# Patient Record
Sex: Female | Born: 1965 | Race: Black or African American | Hispanic: No | Marital: Married | Smoking: Never smoker
Health system: Southern US, Community
[De-identification: ages and names within clinical notes are randomized; demographics above are authoritative.]

## PROBLEM LIST (undated history)

## (undated) DIAGNOSIS — IMO0002 Reserved for concepts with insufficient information to code with codable children: Secondary | ICD-10-CM

## (undated) DIAGNOSIS — J309 Allergic rhinitis, unspecified: Secondary | ICD-10-CM

## (undated) DIAGNOSIS — D219 Benign neoplasm of connective and other soft tissue, unspecified: Secondary | ICD-10-CM

## (undated) DIAGNOSIS — D649 Anemia, unspecified: Secondary | ICD-10-CM

## (undated) DIAGNOSIS — I10 Essential (primary) hypertension: Secondary | ICD-10-CM

## (undated) DIAGNOSIS — N946 Dysmenorrhea, unspecified: Secondary | ICD-10-CM

## (undated) DIAGNOSIS — B019 Varicella without complication: Secondary | ICD-10-CM

## (undated) HISTORY — DX: Varicella without complication: B01.9

## (undated) HISTORY — PX: NASAL SINUS SURGERY: SHX719

## (undated) HISTORY — DX: Allergic rhinitis, unspecified: J30.9

## (undated) HISTORY — PX: ABDOMINAL HYSTERECTOMY: SHX81

## (undated) HISTORY — DX: Benign neoplasm of connective and other soft tissue, unspecified: D21.9

## (undated) HISTORY — DX: Reserved for concepts with insufficient information to code with codable children: IMO0002

## (undated) HISTORY — DX: Dysmenorrhea, unspecified: N94.6

---

## 1998-03-29 ENCOUNTER — Ambulatory Visit (HOSPITAL_COMMUNITY): Admission: RE | Admit: 1998-03-29 | Discharge: 1998-03-29 | Payer: Self-pay | Admitting: *Deleted

## 1998-10-05 ENCOUNTER — Emergency Department (HOSPITAL_COMMUNITY): Admission: EM | Admit: 1998-10-05 | Discharge: 1998-10-05 | Payer: Self-pay | Admitting: Emergency Medicine

## 1999-01-06 ENCOUNTER — Other Ambulatory Visit: Admission: RE | Admit: 1999-01-06 | Discharge: 1999-01-06 | Payer: Self-pay | Admitting: Obstetrics and Gynecology

## 1999-04-10 ENCOUNTER — Encounter (INDEPENDENT_AMBULATORY_CARE_PROVIDER_SITE_OTHER): Payer: Self-pay

## 1999-04-10 ENCOUNTER — Inpatient Hospital Stay (HOSPITAL_COMMUNITY): Admission: AD | Admit: 1999-04-10 | Discharge: 1999-04-13 | Payer: Self-pay | Admitting: Obstetrics and Gynecology

## 1999-04-14 ENCOUNTER — Encounter (HOSPITAL_COMMUNITY): Admission: RE | Admit: 1999-04-14 | Discharge: 1999-07-13 | Payer: Self-pay | Admitting: Gynecology

## 2000-05-07 ENCOUNTER — Other Ambulatory Visit: Admission: RE | Admit: 2000-05-07 | Discharge: 2000-05-07 | Payer: Self-pay | Admitting: *Deleted

## 2001-05-23 ENCOUNTER — Other Ambulatory Visit: Admission: RE | Admit: 2001-05-23 | Discharge: 2001-05-23 | Payer: Self-pay | Admitting: *Deleted

## 2001-11-04 ENCOUNTER — Emergency Department (HOSPITAL_COMMUNITY): Admission: EM | Admit: 2001-11-04 | Discharge: 2001-11-04 | Payer: Self-pay | Admitting: *Deleted

## 2002-05-11 ENCOUNTER — Other Ambulatory Visit: Admission: RE | Admit: 2002-05-11 | Discharge: 2002-05-11 | Payer: Self-pay | Admitting: *Deleted

## 2002-09-15 ENCOUNTER — Inpatient Hospital Stay (HOSPITAL_COMMUNITY): Admission: RE | Admit: 2002-09-15 | Discharge: 2002-09-17 | Payer: Self-pay | Admitting: *Deleted

## 2002-09-15 ENCOUNTER — Encounter (INDEPENDENT_AMBULATORY_CARE_PROVIDER_SITE_OTHER): Payer: Self-pay | Admitting: *Deleted

## 2003-01-09 ENCOUNTER — Ambulatory Visit (HOSPITAL_COMMUNITY): Admission: RE | Admit: 2003-01-09 | Discharge: 2003-01-09 | Payer: Self-pay | Admitting: Family Medicine

## 2003-01-09 ENCOUNTER — Encounter: Payer: Self-pay | Admitting: Family Medicine

## 2003-05-09 ENCOUNTER — Encounter: Admission: RE | Admit: 2003-05-09 | Discharge: 2003-05-09 | Payer: Self-pay | Admitting: Family Medicine

## 2003-05-09 ENCOUNTER — Encounter: Payer: Self-pay | Admitting: Family Medicine

## 2003-07-19 ENCOUNTER — Emergency Department (HOSPITAL_COMMUNITY): Admission: EM | Admit: 2003-07-19 | Discharge: 2003-07-19 | Payer: Self-pay | Admitting: Emergency Medicine

## 2004-05-22 ENCOUNTER — Emergency Department (HOSPITAL_COMMUNITY): Admission: EM | Admit: 2004-05-22 | Discharge: 2004-05-22 | Payer: Self-pay | Admitting: Emergency Medicine

## 2004-07-11 ENCOUNTER — Ambulatory Visit (HOSPITAL_COMMUNITY): Admission: RE | Admit: 2004-07-11 | Discharge: 2004-07-11 | Payer: Self-pay | Admitting: Family Medicine

## 2004-09-23 ENCOUNTER — Ambulatory Visit (HOSPITAL_COMMUNITY): Admission: RE | Admit: 2004-09-23 | Discharge: 2004-09-23 | Payer: Self-pay | Admitting: Family Medicine

## 2005-06-13 ENCOUNTER — Emergency Department (HOSPITAL_COMMUNITY): Admission: EM | Admit: 2005-06-13 | Discharge: 2005-06-13 | Payer: Self-pay | Admitting: Family Medicine

## 2006-05-11 ENCOUNTER — Encounter: Admission: RE | Admit: 2006-05-11 | Discharge: 2006-05-11 | Payer: Self-pay | Admitting: Obstetrics and Gynecology

## 2006-05-18 ENCOUNTER — Encounter: Admission: RE | Admit: 2006-05-18 | Discharge: 2006-05-18 | Payer: Self-pay | Admitting: Obstetrics and Gynecology

## 2007-05-13 ENCOUNTER — Encounter: Admission: RE | Admit: 2007-05-13 | Discharge: 2007-05-13 | Payer: Self-pay | Admitting: Family Medicine

## 2007-10-19 ENCOUNTER — Ambulatory Visit (HOSPITAL_COMMUNITY): Admission: RE | Admit: 2007-10-19 | Discharge: 2007-10-19 | Payer: Self-pay | Admitting: Family Medicine

## 2008-03-25 ENCOUNTER — Emergency Department (HOSPITAL_COMMUNITY): Admission: EM | Admit: 2008-03-25 | Discharge: 2008-03-25 | Payer: Self-pay | Admitting: Emergency Medicine

## 2008-05-14 ENCOUNTER — Encounter: Admission: RE | Admit: 2008-05-14 | Discharge: 2008-05-14 | Payer: Self-pay | Admitting: Family Medicine

## 2009-05-15 ENCOUNTER — Encounter: Admission: RE | Admit: 2009-05-15 | Discharge: 2009-05-15 | Payer: Self-pay | Admitting: Family Medicine

## 2010-05-16 ENCOUNTER — Encounter: Admission: RE | Admit: 2010-05-16 | Discharge: 2010-05-16 | Payer: Self-pay | Admitting: Family Medicine

## 2010-08-05 ENCOUNTER — Ambulatory Visit (HOSPITAL_COMMUNITY)
Admission: RE | Admit: 2010-08-05 | Discharge: 2010-08-05 | Payer: Self-pay | Source: Home / Self Care | Attending: Family Medicine | Admitting: Family Medicine

## 2010-09-21 ENCOUNTER — Encounter: Payer: Self-pay | Admitting: Obstetrics and Gynecology

## 2011-01-16 NOTE — Op Note (Signed)
NAME:  Victoria Allen, Victoria Allen                    ACCOUNT NO.:  [id number]   MEDICAL RECORD NO.:  [id number]                   PATIENT TYPE:  INP   LOCATION:  9399                                 FACILITY:  [facility]   PHYSICIAN:  Georgina Peer, M.D.              DATE OF BIRTH:  03/23/1966   DATE OF PROCEDURE:  09/15/2002  DATE OF DISCHARGE:                                 OPERATIVE REPORT   PREOPERATIVE DIAGNOSES:  1. Abdominal pain.  2. Fibroid uterus.  3. Irregular bleeding.   POSTOPERATIVE DIAGNOSES:  1. Abdominal pain.  2. Fibroid uterus.  3. Irregular bleeding.   OPERATION PERFORMED:  Total abdominal hysterectomy.   SURGEON:  Georgina Peer, M.D.   ANESTHESIA:  General.   ESTIMATED BLOOD LOSS:  100 cc.   URINE OUTPUT:  500 cc, clear.   IV FLUIDS:  2300 cc, outflow 200 incisional catheter placed postoperatively.   COMPLICATIONS:  None.   FINDINGS:  Multiple subserosal anterior fibroids.  Normal tubes and ovaries.   INDICATIONS FOR PROCEDURE:  This 45 year old black female with abdominal  pain, especially up under the bladder from fibroids which were impinging on  the bladder, 6-8 weeks' size.  Also cramping and irregular bleeding was  noted.  The patient was admitted for correction of these problems with  ovarian conservation if possible.   DESCRIPTION OF PROCEDURE:  The patient was taken to the operating room,  placed in the dorsal supine position.  She was given a general anesthetic,  prepped and draped in a normal sterile fashion.  The vagina was prepped.  A  Foley catheter placed in her bladder.  A transverse skin incision was made  and the abdomen entered in normal  Pfannenstiel manner with bleeders  cauterized.  The patient was placed in Trendelenburg and a Balfour retractor  was placed.  The uterus had several subserosal fibroids varying from 3-6 cm  in diameter and one was pedunculated.  Tubes and ovaries were normal.  The  appendix normal.  The bowel  normal.  The cul-de-sac normal.  The bowel was  packed away.  The cornua of the uterus were grasped and the round ligaments  bilaterally were suture ligated and divided.  The utero-ovarian ligaments  were clamped, cut, and suture ligated and divided and free tied.  The  uterine vessels were skeletonized.  An anterior bladder flap was created and  the bladder pushed down off the anterior uterine segment.  The uterine  vessels bilaterally were clamped, cut, and suture ligated.  Progressive  bites of the cardinal and broad ligaments down to the level of the  uterosacral ligaments were taken and the bladder was advanced prior to this.  The uterosacral ligaments were clamped, cut, and suture ligated.  The  corners of the vagina were then entered and the corners sutured for security  and hemostasis.  The mid portion of the cuff was whipstitched with a running  suture  and the mid portion closed with a single suture.  Small areas of  bleeders were controlled with ties or fine superficial 2-0 sutures.  The  ureters bilaterally were identified.  There was excellent hemostasis to the  cuff and the pedicles.  The ovarian pedicles were secured to the broad  ligament stumps to suspend them.  The packs and retractors were removed.  The peritoneum was closed with Vicryl suture on the underside of the muscle  and fascia was cauterized for any bleeders.  The fascia was closed with  Vicryl suture.  An outflow 200 catheter was placed through the right edge of  the incision three fingerbreadths above the right edge and 20 cc of 0.25%  Marcaine was given subcutaneously under the incision.  The incision was then  closed with skin staples.  The wound was sterilely dressed.  The patient  received 2 grams of cefoxitin prior to the start of the case.  Sponge,  instrument, and needle counts were correct.  The patient returned to the  recovery area in stable condition.                                                Georgina Peer, M.D.    JPN/MEDQ  D:  09/15/2002  T:  09/15/2002  Job:  [id number]

## 2011-01-16 NOTE — Discharge Summary (Signed)
   NAME:  Victoria Allen, Victoria Allen                    ACCOUNT NO.:  [id number]   MEDICAL RECORD NO.:  [id number]                   PATIENT TYPE:  INP   LOCATION:  9327                                 [facility]ILITY:  [facility]   PHYSICIAN:  Georgina Peer, M.D.              DATE OF BIRTH:  July 16, 1966   DATE OF ADMISSION:  09/15/2002  DATE OF DISCHARGE:  09/17/2002                                 DISCHARGE SUMMARY   ADMISSION DIAGNOSES:  1. Fibroid uterus.  2. Abdominal pain.  3. Irregular bleeding.   DISCHARGE DIAGNOSES:  1. Fibroid uterus.  2. Abdominal pain.  3. Irregular bleeding.   PATIENT HISTORY:  The patient is a 45 year old black female with known  fibroids, abdominal pain, irregular bleeding.  For details see history and  physical.  The patient was admitted and underwent a total abdominal  hysterectomy under general anesthetic with no observed complications.  A  fibroid uterus was noted.  The patient postoperatively had an alpha 200  incisional catheter placed but by the end of the first night this catheter  was leaking and removed.  She was switched from PCA to p.o. medication and  did well.  She passed gas, had a bowel movement, and voided without  difficulty by the second day.  She was taking p.o. pain medicines.  Her  incision was clean and dry.  Postoperative hemoglobin was 10.4 from  preoperative of 12.2.  She was in good spirits and ambulating well and ready  for discharge.  She was discharged on the second postoperative day with a  prescription for Percocet 5/325 number 40.  She was given written discharge  instructions.  She will follow up in the office in four to five days for  staple removal.                                               Georgina Peer, M.D.    JPN/MEDQ  D:  09/17/2002  T:  09/18/2002  Job:  [id number]

## 2011-01-16 NOTE — H&P (Signed)
NAME:  KINDEL, ROCHEFORT                    ACCOUNT NO.:  [id number]   MEDICAL RECORD NO.:  [id number]                   PATIENT TYPE:  INP   LOCATION:  NA                                   [facility]   PHYSICIAN:  Georgina Peer, M.D.              DATE OF BIRTH:  1965/10/25   DATE OF ADMISSION:  09/15/2002  DATE OF DISCHARGE:                                HISTORY & PHYSICAL   ADMISSION DIAGNOSES:  1. Irregular bleeding.  2. Persistent dysmenorrhea.  3. Fibroid uterus.   HISTORY:  This is a 45 year old African-American female who has known  fibroids diagnosed on ultrasound.  Has had bleeding and intermenstrual  spotting and dysmenorrhea in spite of oral contraceptives and nonsteroidal  anti-inflammatory drugs.  She has elected hysterectomy for treatment with  ovarian conservation if appropriate.   PAST MEDICAL HISTORY:  The patient has had one previous delivery.  No heart,  lung, or kidney disease.  The patient has had normal Pap smears.  She has no  sign of genital infection.  She has had normal TSH and prolactin.   REVIEW OF SYSTEMS:  Otherwise negative.   FAMILY HISTORY:  Significant for a positive hypertension, anemia, asthma,  diabetes, cancer, strokes, and migraines.   PAST SURGICAL HISTORY:  Tonsils and sinus surgery.   SOCIAL HISTORY:  The patient is a nonsmoker.  No alcohol.  The patient is of  the [place] faith and attends [employer].   ALLERGIES:  The patient is allergic to PENICILLIN.   PHYSICAL EXAMINATION:  VITAL SIGNS:  Blood pressure 150/90.  HEENT:  Normal.  NECK:  Without thyromegaly or JVD.  LUNGS:  Clear.  HEART:  Regular rate and rhythm.  BREASTS:  Without masses.  ABDOMEN:  Soft and nontender.  There is no masses, organomegaly, or  hepatosplenomegaly.  There is no CVA tenderness.  PELVIC:  Normal external genitals, vagina, and cervix.  Uterus is 6-8 weeks  size with irregularities consistent with fibroids.  An anterior  fibroid  projecting up into the bladder is noted which is tender.  There is good  mobility and no adnexal masses or tenderness.   ADMISSION IMPRESSION:  1. Fibroid uterus causing pain.  2. Dysmenorrhea.  3. Irregular bleeding unresponsive to conservative measures.   PLAN:  The patient to be admitted for total abdominal hysterectomy with  ovarian conservation, removal of the uterus and cervix.  She is aware of  risks, complications of surgery including bowel, bladder, vascular injury,  fistula formation, pelvic prolapse, increased risk of blood clots, and  anesthetic complications.  She accepts these and is willing to proceed.  Surgery is scheduled September 15, 2002 12:30 [facility].  Georgina Peer, M.D.    JPN/MEDQ  D:  09/15/2002  T:  09/15/2002  Job:  [id number]   cc:   [facility]

## 2011-04-07 ENCOUNTER — Other Ambulatory Visit: Payer: Self-pay | Admitting: Family Medicine

## 2011-04-07 DIAGNOSIS — Z1231 Encounter for screening mammogram for malignant neoplasm of breast: Secondary | ICD-10-CM

## 2011-05-18 ENCOUNTER — Ambulatory Visit
Admission: RE | Admit: 2011-05-18 | Discharge: 2011-05-18 | Disposition: A | Discharge status: Home / Self Care | Payer: Managed Care, Other (non HMO) | Source: Ambulatory Visit | Attending: Family Medicine | Admitting: Family Medicine

## 2011-05-18 DIAGNOSIS — Z1231 Encounter for screening mammogram for malignant neoplasm of breast: Secondary | ICD-10-CM

## 2011-05-29 LAB — POCT URINALYSIS DIP (DEVICE)
Glucose, UA: NEGATIVE
Ketones, ur: 15 — AB
Nitrite: NEGATIVE
Operator id: 282151
Protein, ur: NEGATIVE
Specific Gravity, Urine: >=1.03
Urobilinogen, UA: 0.2
pH: 5.5

## 2011-05-29 LAB — POCT PREGNANCY, URINE
Operator id: 282151
Preg Test, Ur: NEGATIVE

## 2012-04-12 ENCOUNTER — Other Ambulatory Visit: Payer: Self-pay | Admitting: Family Medicine

## 2012-04-12 DIAGNOSIS — Z1231 Encounter for screening mammogram for malignant neoplasm of breast: Secondary | ICD-10-CM

## 2012-05-18 ENCOUNTER — Ambulatory Visit: Payer: Managed Care, Other (non HMO)

## 2012-06-03 ENCOUNTER — Ambulatory Visit
Admission: RE | Admit: 2012-06-03 | Discharge: 2012-06-03 | Disposition: A | Discharge status: Home / Self Care | Payer: 59 | Source: Ambulatory Visit | Attending: Family Medicine | Admitting: Family Medicine

## 2012-06-03 DIAGNOSIS — Z1231 Encounter for screening mammogram for malignant neoplasm of breast: Secondary | ICD-10-CM

## 2012-09-02 ENCOUNTER — Encounter (HOSPITAL_COMMUNITY): Payer: Self-pay | Admitting: Emergency Medicine

## 2012-09-02 ENCOUNTER — Emergency Department (HOSPITAL_COMMUNITY)
Admission: EM | Admit: 2012-09-02 | Discharge: 2012-09-02 | Disposition: A | Discharge status: Home / Self Care | Payer: 59 | Attending: Emergency Medicine | Admitting: Emergency Medicine

## 2012-09-02 DIAGNOSIS — I10 Essential (primary) hypertension: Secondary | ICD-10-CM | POA: Insufficient documentation

## 2012-09-02 DIAGNOSIS — Z79899 Other long term (current) drug therapy: Secondary | ICD-10-CM | POA: Insufficient documentation

## 2012-09-02 DIAGNOSIS — R110 Nausea: Secondary | ICD-10-CM | POA: Insufficient documentation

## 2012-09-02 DIAGNOSIS — R42 Dizziness and giddiness: Secondary | ICD-10-CM

## 2012-09-02 HISTORY — DX: Essential (primary) hypertension: I10

## 2012-09-02 MED ORDER — MECLIZINE HCL 25 MG PO TABS: 25.0000 mg | ORAL_TABLET | Freq: Three times a day (TID) | ORAL | Status: DC | PRN

## 2012-09-02 MED ORDER — MECLIZINE HCL 25 MG PO TABS
25.0000 mg | ORAL_TABLET | Freq: Once | ORAL | Status: AC
  Administered 2012-09-02: 25 mg via ORAL
  Filled 2012-09-02: qty 1

## 2012-09-02 MED ORDER — DIAZEPAM 5 MG PO TABS: 5.0000 mg | ORAL_TABLET | Freq: Four times a day (QID) | ORAL | Status: DC | PRN

## 2012-09-02 MED ORDER — DIAZEPAM 5 MG PO TABS
5.0000 mg | ORAL_TABLET | Freq: Once | ORAL | Status: AC
  Administered 2012-09-02: 5 mg via ORAL
  Filled 2012-09-02: qty 1

## 2012-09-02 NOTE — ED Notes (Signed)
Per patient rolled over in bed and room became dizzy-nausea, vomiting-no chest pain, no SOB

## 2012-09-02 NOTE — ED Notes (Signed)
Per patient, states same kind of symptoms in October, only lasting seconds-resolved on its own

## 2012-09-02 NOTE — ED Provider Notes (Signed)
History    33y female with vertigo. Onset this morning. Patient with a sensation that the room is spinning to her left side. Patient first noticed this morning when she was getting out of bed. She laid back down with improvement. She then got back up to get dressed and symptoms returned. Again improved with rest. No other associated symptoms. Patient states that she did have a similar episode approximately one month ago which lasted for 10 seconds then resolved. This episode 1 month ago was associated with tinnitus. Her episodes today have not though. She denies any ear pain or sensation of ear fullness. No headaches. No visual changes. No numbness, tingling or loss of strength. No recent antibiotic use. No diuretic use. Pt recently came back from a cruise.   CSN: [id number]  Arrival date & time 09/02/12  7829   First MD Initiated Contact with Patient 09/02/12 0750      Chief Complaint  Patient presents with  . Dizziness  . Nausea    (Consider location/radiation/quality/duration/timing/severity/associated sxs/prior treatment) HPI  Past Medical History  Diagnosis Date  . Hypertension     History reviewed. No pertinent past surgical history.  History reviewed. No pertinent family history.  History  Substance Use Topics  . Smoking status: Never Smoker   . Smokeless tobacco: Not on file  . Alcohol Use: Yes    OB History    Grav Para Term Preterm Abortions TAB SAB Ect Mult Living                  Review of Systems  All systems reviewed and negative, other than as noted in HPI.   Allergies  Penicillins  Home Medications   Current Outpatient Rx  Name  Route  Sig  Dispense  Refill  . FEXOFENADINE HCL 180 MG PO TABS   Oral   Take 180 mg by mouth daily.         . IBUPROFEN 200 MG PO TABS   Oral   Take 800 mg by mouth every 8 (eight) hours as needed. For pain.         Marland Kitchen OLMESARTAN-AMLODIPINE-HCTZ 40-5-25 MG PO TABS   Oral   Take 1 tablet by mouth daily.             BP 153/97  Pulse 87  Temp 98.3 F (36.8 C) (Oral)  Resp 16  SpO2 100%  Physical Exam  Nursing note and vitals reviewed. Constitutional: She is oriented to person, place, and time. She appears well-developed and well-nourished. No distress.  HENT:  Head: Normocephalic and atraumatic.  Right Ear: External ear normal.  Left Ear: External ear normal.       Horizontal nystagmus elicited with Dix-Hallpike maneuver with patient's head turned to her right side. There was a latent period of approximately 3-4 seconds before onset of symptoms. Pt wouldn't allow repeat testing for fatiguability. Negative Hennebert's sign.   Eyes: Conjunctivae normal and EOM are normal. Pupils are equal, round, and reactive to light. Right eye exhibits no discharge. Left eye exhibits no discharge.  Neck: Neck supple.  Cardiovascular: Normal rate, regular rhythm and normal heart sounds.  Exam reveals no gallop and no friction rub.   No murmur heard. Pulmonary/Chest: Effort normal and breath sounds normal. No respiratory distress.  Abdominal: Soft. She exhibits no distension. There is no tenderness.  Musculoskeletal: She exhibits no edema and no tenderness.  Neurological: She is alert and oriented to person, place, and time. No cranial nerve deficit. She  exhibits normal muscle tone. Coordination normal.  Skin: Skin is warm and dry.  Psychiatric: She has a normal mood and affect. Her behavior is normal. Thought content normal.    ED Course  Procedures (including critical care time)  Labs Reviewed - No data to display No results found.  EKG:  Rhythm: normal sinus Rate: 76 Axis: normal  Intervals/Conduction: LAE ST segments: NS St changes Comparison: ST segment changes in the precordial leads and also inferiorly noted on previous EKG from 05/22/2004   1. Vertigo       MDM  47 year old female with vertigo. This is most likely a peripheral cause, probably BPPV. Her symptoms are positional.  Reproducible with Dix-Hallpike maneuvers. Latent period.  Nonfocal neurological examination. Consider central cause, but doubt. Plan symptomatic treatment and Epley Maneuver's. ENT referral if symptoms persist. Emergent return precautions discussed.          Raeford Razor, MD 09/02/12 [phone]

## 2013-05-23 ENCOUNTER — Other Ambulatory Visit: Payer: Self-pay

## 2013-05-23 DIAGNOSIS — Z1231 Encounter for screening mammogram for malignant neoplasm of breast: Secondary | ICD-10-CM

## 2013-06-09 ENCOUNTER — Ambulatory Visit
Admission: RE | Admit: 2013-06-09 | Discharge: 2013-06-09 | Disposition: A | Discharge status: Home / Self Care | Payer: 59 | Source: Ambulatory Visit

## 2013-06-09 DIAGNOSIS — Z1231 Encounter for screening mammogram for malignant neoplasm of breast: Secondary | ICD-10-CM

## 2013-08-28 ENCOUNTER — Other Ambulatory Visit (HOSPITAL_COMMUNITY): Payer: Self-pay | Admitting: Orthopedic Surgery

## 2013-08-28 DIAGNOSIS — M25562 Pain in left knee: Secondary | ICD-10-CM

## 2013-09-01 ENCOUNTER — Ambulatory Visit (HOSPITAL_COMMUNITY)
Admission: RE | Admit: 2013-09-01 | Discharge: 2013-09-01 | Disposition: A | Discharge status: Home / Self Care | Payer: 59 | Source: Ambulatory Visit | Attending: Orthopedic Surgery | Admitting: Orthopedic Surgery

## 2013-09-01 DIAGNOSIS — M25562 Pain in left knee: Secondary | ICD-10-CM

## 2013-09-01 DIAGNOSIS — M7120 Synovial cyst of popliteal space [Baker], unspecified knee: Secondary | ICD-10-CM | POA: Insufficient documentation

## 2013-09-01 DIAGNOSIS — M6740 Ganglion, unspecified site: Secondary | ICD-10-CM | POA: Insufficient documentation

## 2013-09-01 DIAGNOSIS — M25469 Effusion, unspecified knee: Secondary | ICD-10-CM | POA: Insufficient documentation

## 2013-09-01 DIAGNOSIS — M2240 Chondromalacia patellae, unspecified knee: Secondary | ICD-10-CM | POA: Insufficient documentation

## 2013-09-01 DIAGNOSIS — R609 Edema, unspecified: Secondary | ICD-10-CM | POA: Insufficient documentation

## 2013-09-04 ENCOUNTER — Ambulatory Visit (HOSPITAL_COMMUNITY): Payer: 59

## 2013-10-17 ENCOUNTER — Ambulatory Visit: Payer: 59 | Admitting: Physical Therapy

## 2013-10-23 ENCOUNTER — Ambulatory Visit: Payer: 59 | Attending: Family Medicine

## 2013-10-23 DIAGNOSIS — M25569 Pain in unspecified knee: Secondary | ICD-10-CM | POA: Insufficient documentation

## 2013-10-23 DIAGNOSIS — IMO0001 Reserved for inherently not codable concepts without codable children: Secondary | ICD-10-CM | POA: Insufficient documentation

## 2013-10-23 DIAGNOSIS — R269 Unspecified abnormalities of gait and mobility: Secondary | ICD-10-CM | POA: Insufficient documentation

## 2013-10-23 DIAGNOSIS — M25669 Stiffness of unspecified knee, not elsewhere classified: Secondary | ICD-10-CM | POA: Insufficient documentation

## 2013-10-30 ENCOUNTER — Ambulatory Visit: Payer: 59 | Attending: Family Medicine

## 2013-10-30 DIAGNOSIS — M25669 Stiffness of unspecified knee, not elsewhere classified: Secondary | ICD-10-CM | POA: Insufficient documentation

## 2013-10-30 DIAGNOSIS — M25569 Pain in unspecified knee: Secondary | ICD-10-CM | POA: Insufficient documentation

## 2013-10-30 DIAGNOSIS — R269 Unspecified abnormalities of gait and mobility: Secondary | ICD-10-CM | POA: Insufficient documentation

## 2013-10-30 DIAGNOSIS — IMO0001 Reserved for inherently not codable concepts without codable children: Secondary | ICD-10-CM | POA: Insufficient documentation

## 2014-05-08 ENCOUNTER — Other Ambulatory Visit: Payer: Self-pay

## 2014-05-08 DIAGNOSIS — Z1231 Encounter for screening mammogram for malignant neoplasm of breast: Secondary | ICD-10-CM

## 2014-06-11 ENCOUNTER — Ambulatory Visit
Admission: RE | Admit: 2014-06-11 | Discharge: 2014-06-11 | Disposition: A | Discharge status: Home / Self Care | Payer: 59 | Source: Ambulatory Visit

## 2014-06-11 DIAGNOSIS — Z1231 Encounter for screening mammogram for malignant neoplasm of breast: Secondary | ICD-10-CM

## 2014-06-12 ENCOUNTER — Other Ambulatory Visit: Payer: Self-pay | Admitting: Family Medicine

## 2014-06-12 DIAGNOSIS — R928 Other abnormal and inconclusive findings on diagnostic imaging of breast: Secondary | ICD-10-CM

## 2014-06-14 ENCOUNTER — Ambulatory Visit
Admission: RE | Admit: 2014-06-14 | Discharge: 2014-06-14 | Disposition: A | Discharge status: Home / Self Care | Payer: 59 | Source: Ambulatory Visit | Attending: Family Medicine | Admitting: Family Medicine

## 2014-06-14 ENCOUNTER — Ambulatory Visit: Admission: RE | Admit: 2014-06-14 | Payer: 59 | Source: Ambulatory Visit

## 2014-06-14 ENCOUNTER — Other Ambulatory Visit: Payer: Self-pay | Admitting: Family Medicine

## 2014-06-14 DIAGNOSIS — R928 Other abnormal and inconclusive findings on diagnostic imaging of breast: Secondary | ICD-10-CM

## 2014-06-21 ENCOUNTER — Other Ambulatory Visit: Payer: 59

## 2015-02-11 ENCOUNTER — Emergency Department (HOSPITAL_COMMUNITY)
Admission: EM | Admit: 2015-02-11 | Discharge: 2015-02-11 | Disposition: A | Discharge status: Home / Self Care | Payer: 59 | Source: Home / Self Care | Attending: Family Medicine | Admitting: Family Medicine

## 2015-02-11 ENCOUNTER — Encounter (HOSPITAL_COMMUNITY): Payer: Self-pay | Admitting: *Deleted

## 2015-02-11 ENCOUNTER — Emergency Department (INDEPENDENT_AMBULATORY_CARE_PROVIDER_SITE_OTHER): Payer: 59

## 2015-02-11 DIAGNOSIS — J4521 Mild intermittent asthma with (acute) exacerbation: Secondary | ICD-10-CM

## 2015-02-11 MED ORDER — METHYLPREDNISOLONE SODIUM SUCC 125 MG IJ SOLR
125.0000 mg | Freq: Once | INTRAMUSCULAR | Status: AC
  Administered 2015-02-11: 125 mg via INTRAMUSCULAR

## 2015-02-11 MED ORDER — ALBUTEROL SULFATE (2.5 MG/3ML) 0.083% IN NEBU
5.0000 mg | INHALATION_SOLUTION | Freq: Once | RESPIRATORY_TRACT | Status: AC
  Administered 2015-02-11: 5 mg via RESPIRATORY_TRACT

## 2015-02-11 MED ORDER — IPRATROPIUM BROMIDE 0.02 % IN SOLN
0.5000 mg | Freq: Once | RESPIRATORY_TRACT | Status: AC
  Administered 2015-02-11: 0.5 mg via RESPIRATORY_TRACT

## 2015-02-11 MED ORDER — IPRATROPIUM BROMIDE 0.02 % IN SOLN
RESPIRATORY_TRACT | Status: AC
  Filled 2015-02-11: qty 2.5

## 2015-02-11 MED ORDER — ALBUTEROL SULFATE (2.5 MG/3ML) 0.083% IN NEBU
INHALATION_SOLUTION | RESPIRATORY_TRACT | Status: AC
  Filled 2015-02-11: qty 6

## 2015-02-11 MED ORDER — METHYLPREDNISOLONE SODIUM SUCC 125 MG IJ SOLR
INTRAMUSCULAR | Status: AC
  Filled 2015-02-11: qty 2

## 2015-02-11 MED ORDER — METHYLPREDNISOLONE 4 MG PO TBPK: ORAL_TABLET | ORAL | Status: DC

## 2015-02-11 NOTE — ED Notes (Signed)
Pt    Reports     Symptoms  Of  A  persistant  Cough     X  2  Weeks  Getting  Worse   Symptoms  Not  releived  By Otc  meds             Did not take  bp meds  Today

## 2015-02-11 NOTE — ED Provider Notes (Addendum)
CSN: [id number]     Arrival date & time 02/11/15  6 History   First MD Initiated Contact with Patient 02/11/15 1742     Chief Complaint  Patient presents with  . URI   (Consider location/radiation/quality/duration/timing/severity/associated sxs/prior Treatment) Patient is a 49 y.o. female presenting with URI. The history is provided by the patient.  URI Presenting symptoms: congestion, cough and rhinorrhea   Presenting symptoms: no fever   Severity:  Moderate Onset quality:  Gradual Duration:  8 days Progression:  Unchanged Chronicity:  New Relieved by:  None tried Worsened by:  Nothing tried Ineffective treatments:  None tried   Past Medical History  Diagnosis Date  . Hypertension   . Allergic rhinitis   . Fibroids     with dysmenorrhea  . Dysmenorrhea   . Varicella infection   . Herniated disc    Past Surgical History  Procedure Laterality Date  . Abdominal hysterectomy    . Nasal sinus surgery     Family History  Problem Relation Age of Onset  . Hypertension Mother   . Heart attack Father   . Cancer - Prostate Father   . Hypertension Father   . Diabetes Father   . Hypertension Maternal Grandmother   . Cancer - Colon Maternal Grandmother   . Hypertension Paternal Grandmother   . Heart attack Paternal Grandmother    History  Substance Use Topics  . Smoking status: Never Smoker   . Smokeless tobacco: Not on file  . Alcohol Use: Yes   OB History    No data available     Review of Systems  Constitutional: Negative.  Negative for fever.  HENT: Positive for congestion, postnasal drip and rhinorrhea.   Respiratory: Positive for cough. Negative for chest tightness.   Cardiovascular: Negative.     Allergies  Codeine and Penicillins  Home Medications   Prior to Admission medications   Medication Sig Start Date End Date Taking? Authorizing Provider  diazepam (VALIUM) 5 MG tablet Take 1 tablet (5 mg total) by mouth every 6 (six) hours as needed  (vertigo). 09/02/12   Virgel Manifold, MD  fexofenadine (ALLEGRA) 180 MG tablet Take 180 mg by mouth daily.    Historical Provider, MD  ibuprofen (ADVIL,MOTRIN) 200 MG tablet Take 800 mg by mouth every 8 (eight) hours as needed. For pain.    Historical Provider, MD  meclizine (ANTIVERT) 25 MG tablet Take 1 tablet (25 mg total) by mouth 3 (three) times daily as needed for dizziness (vertigo). 09/02/12   Virgel Manifold, MD  methylPREDNISolone (MEDROL DOSEPAK) 4 MG TBPK tablet follow package directions, start on tues, take daily until finished 02/11/15   Billy Fischer, MD  Olmesartan-Amlodipine-HCTZ [facility]) 40-5-25 MG TABS Take 1 tablet by mouth daily.    Historical Provider, MD   BP 218/104 mmHg  Pulse 72  Temp(Src) 98.6 F (37 C) (Oral)  Resp 20  SpO2 100% Physical Exam  Constitutional: She is oriented to person, place, and time. She appears well-developed and well-nourished. No distress.  HENT:  Mouth/Throat: Oropharynx is clear and moist.  Eyes: Conjunctivae are normal. Pupils are equal, round, and reactive to light.  Neck: Normal range of motion. Neck supple.  Cardiovascular: Normal rate and normal heart sounds.   Pulmonary/Chest: Effort normal. She has wheezes. She has rhonchi. She exhibits no tenderness.  Lymphadenopathy:    She has no cervical adenopathy.  Neurological: She is alert and oriented to person, place, and time.  Skin: Skin is warm and  dry.  Nursing note and vitals reviewed.   ED Course  Procedures (including critical care time) Labs Review Labs Reviewed - No data to display  Imaging Review Dg Chest 2 View  02/11/2015   CLINICAL DATA:  Wheezing at night for 2 weeks.  EXAM: CHEST  2 VIEW  COMPARISON:  PA and lateral chest 05/22/2004.  FINDINGS: Heart size and mediastinal contours are within normal limits. Both lungs are clear. Visualized skeletal structures are unremarkable.  IMPRESSION: Negative exam.   Electronically Signed   By: Inge Rise M.D.   On: 02/11/2015  18:20     MDM   1. Asthmatic bronchitis with exacerbation, mild intermittent   sx improved, lungs clear throughout after neb.    Billy Fischer, MD 02/11/15 Valerie Roys  Billy Fischer, MD 02/11/15 [phone]

## 2015-02-11 NOTE — Discharge Instructions (Signed)
Drink plenty of fluids as discussed, use medicine as prescribed, and mucinex or delsym for cough. Return or see your doctor if further problems °

## 2015-05-17 ENCOUNTER — Other Ambulatory Visit: Payer: Self-pay

## 2015-05-17 DIAGNOSIS — Z1231 Encounter for screening mammogram for malignant neoplasm of breast: Secondary | ICD-10-CM

## 2015-05-20 ENCOUNTER — Other Ambulatory Visit (HOSPITAL_COMMUNITY): Payer: Self-pay | Admitting: Family Medicine

## 2015-05-20 DIAGNOSIS — R202 Paresthesia of skin: Secondary | ICD-10-CM

## 2015-05-20 DIAGNOSIS — M542 Cervicalgia: Secondary | ICD-10-CM

## 2015-05-22 ENCOUNTER — Ambulatory Visit (HOSPITAL_COMMUNITY): Admission: RE | Admit: 2015-05-22 | Payer: 59 | Source: Ambulatory Visit

## 2015-05-29 ENCOUNTER — Ambulatory Visit (HOSPITAL_COMMUNITY): Admission: RE | Admit: 2015-05-29 | Payer: 59 | Source: Ambulatory Visit

## 2015-05-29 ENCOUNTER — Other Ambulatory Visit (HOSPITAL_COMMUNITY): Payer: 59

## 2015-05-29 ENCOUNTER — Ambulatory Visit (HOSPITAL_COMMUNITY)
Admission: RE | Admit: 2015-05-29 | Discharge: 2015-05-29 | Disposition: A | Discharge status: Home / Self Care | Payer: 59 | Source: Ambulatory Visit | Attending: Family Medicine | Admitting: Family Medicine

## 2015-05-29 DIAGNOSIS — R202 Paresthesia of skin: Secondary | ICD-10-CM | POA: Insufficient documentation

## 2015-05-29 DIAGNOSIS — M47892 Other spondylosis, cervical region: Secondary | ICD-10-CM | POA: Diagnosis not present

## 2015-05-29 DIAGNOSIS — M4802 Spinal stenosis, cervical region: Secondary | ICD-10-CM | POA: Insufficient documentation

## 2015-05-30 ENCOUNTER — Other Ambulatory Visit (HOSPITAL_COMMUNITY): Payer: 59

## 2015-05-30 ENCOUNTER — Ambulatory Visit (HOSPITAL_COMMUNITY): Admission: RE | Admit: 2015-05-30 | Payer: 59 | Source: Ambulatory Visit

## 2015-06-04 ENCOUNTER — Other Ambulatory Visit: Payer: Self-pay | Admitting: Neurological Surgery

## 2015-06-04 DIAGNOSIS — M5412 Radiculopathy, cervical region: Secondary | ICD-10-CM

## 2015-06-06 ENCOUNTER — Ambulatory Visit
Admission: RE | Admit: 2015-06-06 | Discharge: 2015-06-06 | Disposition: A | Discharge status: Home / Self Care | Payer: 59 | Source: Ambulatory Visit | Attending: Neurological Surgery | Admitting: Neurological Surgery

## 2015-06-06 DIAGNOSIS — M5412 Radiculopathy, cervical region: Secondary | ICD-10-CM

## 2015-06-06 MED ORDER — TRIAMCINOLONE ACETONIDE 40 MG/ML IJ SUSP (RADIOLOGY)
60.0000 mg | Freq: Once | INTRAMUSCULAR | Status: AC
  Administered 2015-06-06: 60 mg via EPIDURAL

## 2015-06-06 MED ORDER — IOHEXOL 300 MG/ML  SOLN
1.0000 mL | Freq: Once | INTRAMUSCULAR | Status: DC | PRN
  Administered 2015-06-06: 1 mL via EPIDURAL

## 2015-06-06 NOTE — Discharge Instructions (Signed)

## 2015-06-17 ENCOUNTER — Ambulatory Visit
Admission: RE | Admit: 2015-06-17 | Discharge: 2015-06-17 | Disposition: A | Discharge status: Home / Self Care | Payer: 59 | Source: Ambulatory Visit

## 2015-06-17 DIAGNOSIS — Z1231 Encounter for screening mammogram for malignant neoplasm of breast: Secondary | ICD-10-CM

## 2015-06-25 ENCOUNTER — Other Ambulatory Visit (HOSPITAL_COMMUNITY): Payer: Self-pay | Admitting: Neurological Surgery

## 2015-07-09 ENCOUNTER — Encounter (HOSPITAL_COMMUNITY): Payer: Self-pay

## 2015-07-09 ENCOUNTER — Encounter (HOSPITAL_COMMUNITY)
Admission: RE | Admit: 2015-07-09 | Discharge: 2015-07-09 | Disposition: A | Discharge status: Home / Self Care | Payer: 59 | Source: Ambulatory Visit | Attending: Neurological Surgery | Admitting: Neurological Surgery

## 2015-07-09 DIAGNOSIS — M4802 Spinal stenosis, cervical region: Secondary | ICD-10-CM | POA: Diagnosis not present

## 2015-07-09 DIAGNOSIS — Z01818 Encounter for other preprocedural examination: Secondary | ICD-10-CM | POA: Diagnosis not present

## 2015-07-09 HISTORY — DX: Anemia, unspecified: D64.9

## 2015-07-09 LAB — BASIC METABOLIC PANEL
Anion gap: 8 (ref 5–15)
BUN: 11 mg/dL (ref 6–20)
CO2: 31 mmol/L (ref 22–32)
Calcium: 9.6 mg/dL (ref 8.9–10.3)
Chloride: 98 mmol/L — ABNORMAL LOW (ref 101–111)
Creatinine, Ser: 0.77 mg/dL (ref 0.44–1.00)
GFR calc Af Amer: >60 mL/min (ref >60)
GFR calc non Af Amer: >60 mL/min (ref >60)
Glucose, Bld: 100 mg/dL — ABNORMAL HIGH (ref 65–99)
Potassium: 3.3 mmol/L — ABNORMAL LOW (ref 3.5–5.1)
Sodium: 137 mmol/L (ref 135–145)

## 2015-07-09 LAB — PROTIME-INR
INR: 1.03 (ref 0.00–1.49)
Prothrombin Time: 13.7 seconds (ref 11.6–15.2)

## 2015-07-09 LAB — CBC WITH DIFFERENTIAL/PLATELET
Basophils Absolute: 0 10*3/uL (ref 0.0–0.1)
Basophils Relative: 0 %
Eosinophils Absolute: 0.1 10*3/uL (ref 0.0–0.7)
Eosinophils Relative: 1 %
HCT: 44.8 % (ref 36.0–46.0)
Hemoglobin: 14.5 g/dL (ref 12.0–15.0)
Lymphocytes Relative: 27 %
Lymphs Abs: 3.1 10*3/uL (ref 0.7–4.0)
MCH: 28 pg (ref 26.0–34.0)
MCHC: 32.4 g/dL (ref 30.0–36.0)
MCV: 86.7 fL (ref 78.0–100.0)
Monocytes Absolute: 0.8 10*3/uL (ref 0.1–1.0)
Monocytes Relative: 7 %
Neutro Abs: 7.4 10*3/uL (ref 1.7–7.7)
Neutrophils Relative %: 65 %
Platelets: 242 10*3/uL (ref 150–400)
RBC: 5.17 MIL/uL — ABNORMAL HIGH (ref 3.87–5.11)
RDW: 12.9 % (ref 11.5–15.5)
WBC: 11.4 10*3/uL — ABNORMAL HIGH (ref 4.0–10.5)

## 2015-07-09 LAB — SURGICAL PCR SCREEN
MRSA, PCR: NEGATIVE
Staphylococcus aureus: NEGATIVE

## 2015-07-09 NOTE — Pre-Procedure Instructions (Signed)
    Zarianna R Bircher  07/09/2015      [facility] - [place], [place] - [address] [place] [address] Phone: [phone] Fax: [phone]    Your procedure is scheduled on 07-17-2015    Wednesday  .  Report to [facility] Admitting at 9:45  A.M.    Call this number if you have problems the morning of surgery:  [phone]   Remember:  Do not eat food or drink liquids after midnight.   Take these medicines the morning of surgery with A SIP OF WATER diazepam(valium) if needed,meclizine)Antivert),if needed,fexofenadine   Do not wear jewelry  Do not wear lotions, powders, or perfumes.  You may not wear deodorant.  Do not shave 48 hours prior to surgery.  Men may shave face and neck.   Do not bring valuables to the hospital.  [facility] is not responsible for any belongings or valuables.  Contacts, dentures or bridgework may not be worn into surgery.  Leave your suitcase in the car.  After surgery it may be brought to your room.  For patients admitted to the hospital, discharge time will be determined by your treatment team.  Patients discharged the day of surgery will not be allowed to drive home.    Special instructions:  See attached Sheet for instructions on CHG shower   Please read over the following fact sheets that you were given. Pain Booklet and Surgical Site Infection Prevention

## 2015-07-10 NOTE — Progress Notes (Signed)
Anesthesia Chart Review: Patient is a 49 year old female scheduled for C5-6, C6-7 ACDF on 07/17/15 by Dr. Ronnald Ramp.  History includes HTN, anemia, never smoker, nasal sinus surgery.  07/09/15 EKG: NSR with sinus arrhythmia, possible LAE, nonspecific ST/T wave abnormality. EKG appears stable when compared to tracing from 09/02/12.  02/11/15 CXR: Negative.  Preoperative labs noted.  If no acute changes then I anticipate that she can proceed.  George Hugh [facility]/Anesthesiology Phone [phone] 07/10/2015 2:51 PM

## 2015-07-17 ENCOUNTER — Ambulatory Visit (HOSPITAL_COMMUNITY)
Admission: RE | Admit: 2015-07-17 | Discharge: 2015-07-18 | Disposition: A | Discharge status: Home / Self Care | Payer: 59 | Source: Ambulatory Visit | Attending: Neurological Surgery | Admitting: Neurological Surgery

## 2015-07-17 ENCOUNTER — Encounter (HOSPITAL_COMMUNITY)
Admission: RE | Disposition: A | Discharge status: Home / Self Care | Payer: Self-pay | Source: Ambulatory Visit | Attending: Neurological Surgery

## 2015-07-17 ENCOUNTER — Ambulatory Visit (HOSPITAL_COMMUNITY): Payer: 59 | Admitting: Vascular Surgery

## 2015-07-17 ENCOUNTER — Ambulatory Visit (HOSPITAL_COMMUNITY): Payer: 59 | Admitting: Anesthesiology

## 2015-07-17 ENCOUNTER — Encounter (HOSPITAL_COMMUNITY): Payer: Self-pay | Admitting: *Deleted

## 2015-07-17 ENCOUNTER — Ambulatory Visit (HOSPITAL_COMMUNITY): Payer: 59

## 2015-07-17 DIAGNOSIS — M50222 Other cervical disc displacement at C5-C6 level: Secondary | ICD-10-CM | POA: Diagnosis not present

## 2015-07-17 DIAGNOSIS — M50223 Other cervical disc displacement at C6-C7 level: Secondary | ICD-10-CM | POA: Insufficient documentation

## 2015-07-17 DIAGNOSIS — M47812 Spondylosis without myelopathy or radiculopathy, cervical region: Secondary | ICD-10-CM | POA: Diagnosis present

## 2015-07-17 DIAGNOSIS — Z79899 Other long term (current) drug therapy: Secondary | ICD-10-CM | POA: Diagnosis not present

## 2015-07-17 DIAGNOSIS — I10 Essential (primary) hypertension: Secondary | ICD-10-CM | POA: Diagnosis not present

## 2015-07-17 DIAGNOSIS — Z791 Long term (current) use of non-steroidal anti-inflammatories (NSAID): Secondary | ICD-10-CM | POA: Diagnosis not present

## 2015-07-17 DIAGNOSIS — Z981 Arthrodesis status: Secondary | ICD-10-CM

## 2015-07-17 DIAGNOSIS — J309 Allergic rhinitis, unspecified: Secondary | ICD-10-CM | POA: Insufficient documentation

## 2015-07-17 DIAGNOSIS — Z419 Encounter for procedure for purposes other than remedying health state, unspecified: Secondary | ICD-10-CM

## 2015-07-17 HISTORY — PX: ANTERIOR CERVICAL DECOMP/DISCECTOMY FUSION: SHX1161

## 2015-07-17 SURGERY — ANTERIOR CERVICAL DECOMPRESSION/DISCECTOMY FUSION 2 LEVELS
Anesthesia: General

## 2015-07-17 MED ORDER — ONDANSETRON HCL 4 MG/2ML IJ SOLN
4.0000 mg | INTRAMUSCULAR | Status: DC | PRN
  Administered 2015-07-17: 4 mg via INTRAVENOUS
  Filled 2015-07-17: qty 2

## 2015-07-17 MED ORDER — HYDROMORPHONE HCL 1 MG/ML IJ SOLN
INTRAMUSCULAR | Status: AC
  Filled 2015-07-17: qty 1

## 2015-07-17 MED ORDER — PROPOFOL 10 MG/ML IV BOLUS
INTRAVENOUS | Status: AC
  Filled 2015-07-17: qty 20

## 2015-07-17 MED ORDER — 0.9 % SODIUM CHLORIDE (POUR BTL) OPTIME
TOPICAL | Status: DC | PRN
  Administered 2015-07-17: 1000 mL

## 2015-07-17 MED ORDER — ONDANSETRON HCL 4 MG/2ML IJ SOLN
INTRAMUSCULAR | Status: DC | PRN
  Administered 2015-07-17: 4 mg via INTRAVENOUS

## 2015-07-17 MED ORDER — SODIUM CHLORIDE 0.9 % IJ SOLN: 3.0000 mL | Freq: Two times a day (BID) | INTRAMUSCULAR | Status: DC

## 2015-07-17 MED ORDER — THROMBIN 5000 UNITS EX SOLR
CUTANEOUS | Status: DC | PRN
  Administered 2015-07-17: 13:00:00 via TOPICAL

## 2015-07-17 MED ORDER — ACETAMINOPHEN 650 MG RE SUPP: 650.0000 mg | RECTAL | Status: DC | PRN

## 2015-07-17 MED ORDER — OXYCODONE-ACETAMINOPHEN 5-325 MG PO TABS
1.0000 | ORAL_TABLET | ORAL | Status: DC | PRN
  Administered 2015-07-18: 2 via ORAL
  Filled 2015-07-17: qty 2

## 2015-07-17 MED ORDER — HEMOSTATIC AGENTS (NO CHARGE) OPTIME
TOPICAL | Status: DC | PRN
  Administered 2015-07-17: 1 via TOPICAL

## 2015-07-17 MED ORDER — GLYCOPYRROLATE 0.2 MG/ML IJ SOLN
INTRAMUSCULAR | Status: DC | PRN
  Administered 2015-07-17: 0.4 mg via INTRAVENOUS

## 2015-07-17 MED ORDER — MENTHOL 3 MG MT LOZG: 1.0000 | LOZENGE | OROMUCOSAL | Status: DC | PRN

## 2015-07-17 MED ORDER — IRBESARTAN 300 MG PO TABS
300.0000 mg | ORAL_TABLET | Freq: Every day | ORAL | Status: DC
  Administered 2015-07-17: 300 mg via ORAL
  Filled 2015-07-17 (×2): qty 1

## 2015-07-17 MED ORDER — PROMETHAZINE HCL 25 MG/ML IJ SOLN: 6.2500 mg | INTRAMUSCULAR | Status: DC | PRN

## 2015-07-17 MED ORDER — VANCOMYCIN HCL IN DEXTROSE 1-5 GM/200ML-% IV SOLN
1000.0000 mg | INTRAVENOUS | Status: AC
  Administered 2015-07-17: 1000 mg via INTRAVENOUS

## 2015-07-17 MED ORDER — MEPERIDINE HCL 25 MG/ML IJ SOLN: 6.2500 mg | INTRAMUSCULAR | Status: DC | PRN

## 2015-07-17 MED ORDER — VANCOMYCIN HCL IN DEXTROSE 1-5 GM/200ML-% IV SOLN
INTRAVENOUS | Status: AC
  Filled 2015-07-17: qty 200

## 2015-07-17 MED ORDER — MIDAZOLAM HCL 5 MG/5ML IJ SOLN
INTRAMUSCULAR | Status: DC | PRN
  Administered 2015-07-17: 2 mg via INTRAVENOUS

## 2015-07-17 MED ORDER — VANCOMYCIN HCL IN DEXTROSE 1-5 GM/200ML-% IV SOLN
1000.0000 mg | Freq: Once | INTRAVENOUS | Status: AC
  Administered 2015-07-17: 1000 mg via INTRAVENOUS
  Filled 2015-07-17: qty 200

## 2015-07-17 MED ORDER — LACTATED RINGERS IV SOLN
INTRAVENOUS | Status: DC
  Administered 2015-07-17 (×2): via INTRAVENOUS

## 2015-07-17 MED ORDER — METHOCARBAMOL 500 MG PO TABS
500.0000 mg | ORAL_TABLET | Freq: Four times a day (QID) | ORAL | Status: DC | PRN
Start: 2015-07-17 — End: 2015-07-18
  Administered 2015-07-17: 500 mg via ORAL
  Filled 2015-07-17: qty 1

## 2015-07-17 MED ORDER — HYDROCHLOROTHIAZIDE 25 MG PO TABS
25.0000 mg | ORAL_TABLET | Freq: Every day | ORAL | Status: DC
  Administered 2015-07-17: 25 mg via ORAL
  Filled 2015-07-17: qty 1

## 2015-07-17 MED ORDER — ROCURONIUM BROMIDE 50 MG/5ML IV SOLN
INTRAVENOUS | Status: AC
  Filled 2015-07-17: qty 1

## 2015-07-17 MED ORDER — LIDOCAINE HCL (CARDIAC) 20 MG/ML IV SOLN
INTRAVENOUS | Status: DC | PRN
  Administered 2015-07-17: 40 mg via INTRAVENOUS
  Administered 2015-07-17: 60 mg via INTRATRACHEAL

## 2015-07-17 MED ORDER — SODIUM CHLORIDE 0.9 % IR SOLN
Status: DC | PRN
  Administered 2015-07-17: 12:00:00

## 2015-07-17 MED ORDER — AMLODIPINE BESYLATE 5 MG PO TABS
5.0000 mg | ORAL_TABLET | Freq: Every day | ORAL | Status: DC
  Administered 2015-07-17: 5 mg via ORAL
  Filled 2015-07-17 (×2): qty 1

## 2015-07-17 MED ORDER — FENTANYL CITRATE (PF) 100 MCG/2ML IJ SOLN
INTRAMUSCULAR | Status: DC | PRN
  Administered 2015-07-17: 100 ug via INTRAVENOUS
  Administered 2015-07-17: 50 ug via INTRAVENOUS
  Administered 2015-07-17: 100 ug via INTRAVENOUS

## 2015-07-17 MED ORDER — SODIUM CHLORIDE 0.9 % IJ SOLN: 3.0000 mL | INTRAMUSCULAR | Status: DC | PRN

## 2015-07-17 MED ORDER — POTASSIUM CHLORIDE IN NACL 20-0.9 MEQ/L-% IV SOLN
INTRAVENOUS | Status: DC
  Filled 2015-07-17 (×4): qty 1000

## 2015-07-17 MED ORDER — MECLIZINE HCL 12.5 MG PO TABS: 25.0000 mg | ORAL_TABLET | Freq: Three times a day (TID) | ORAL | Status: DC | PRN

## 2015-07-17 MED ORDER — PHENYLEPHRINE HCL 10 MG/ML IJ SOLN
INTRAMUSCULAR | Status: DC | PRN
  Administered 2015-07-17: 80 ug via INTRAVENOUS

## 2015-07-17 MED ORDER — MIDAZOLAM HCL 2 MG/2ML IJ SOLN
INTRAMUSCULAR | Status: AC
  Filled 2015-07-17: qty 2

## 2015-07-17 MED ORDER — HYDROMORPHONE HCL 1 MG/ML IJ SOLN
0.2500 mg | INTRAMUSCULAR | Status: DC | PRN
  Administered 2015-07-17 (×2): 0.5 mg via INTRAVENOUS

## 2015-07-17 MED ORDER — OLMESARTAN-AMLODIPINE-HCTZ 40-5-25 MG PO TABS: 1.0000 | ORAL_TABLET | Freq: Every day | ORAL | Status: DC

## 2015-07-17 MED ORDER — DEXTROSE 5 % IV SOLN
500.0000 mg | Freq: Four times a day (QID) | INTRAVENOUS | Status: DC | PRN
  Filled 2015-07-17: qty 5

## 2015-07-17 MED ORDER — PROPOFOL 10 MG/ML IV BOLUS
INTRAVENOUS | Status: DC | PRN
  Administered 2015-07-17: 200 mg via INTRAVENOUS

## 2015-07-17 MED ORDER — LIDOCAINE HCL (CARDIAC) 20 MG/ML IV SOLN
INTRAVENOUS | Status: AC
  Filled 2015-07-17: qty 5

## 2015-07-17 MED ORDER — SODIUM CHLORIDE 0.9 % IV SOLN: 250.0000 mL | INTRAVENOUS | Status: DC

## 2015-07-17 MED ORDER — PHENOL 1.4 % MT LIQD
1.0000 | OROMUCOSAL | Status: DC | PRN
Start: 2015-07-17 — End: 2015-07-18

## 2015-07-17 MED ORDER — MORPHINE SULFATE (PF) 2 MG/ML IV SOLN
INTRAVENOUS | Status: AC
  Filled 2015-07-17: qty 1

## 2015-07-17 MED ORDER — ONDANSETRON HCL 4 MG/2ML IJ SOLN
INTRAMUSCULAR | Status: AC
  Filled 2015-07-17: qty 2

## 2015-07-17 MED ORDER — LIDOCAINE HCL 4 % MT SOLN
OROMUCOSAL | Status: DC | PRN
  Administered 2015-07-17: 4 mL via TOPICAL

## 2015-07-17 MED ORDER — FENTANYL CITRATE (PF) 250 MCG/5ML IJ SOLN
INTRAMUSCULAR | Status: AC
  Filled 2015-07-17: qty 5

## 2015-07-17 MED ORDER — ACETAMINOPHEN 325 MG PO TABS: 650.0000 mg | ORAL_TABLET | ORAL | Status: DC | PRN

## 2015-07-17 MED ORDER — ROCURONIUM BROMIDE 100 MG/10ML IV SOLN
INTRAVENOUS | Status: DC | PRN
  Administered 2015-07-17: 30 mg via INTRAVENOUS

## 2015-07-17 MED ORDER — NEOSTIGMINE METHYLSULFATE 10 MG/10ML IV SOLN
INTRAVENOUS | Status: DC | PRN
  Administered 2015-07-17: 3 mg via INTRAVENOUS

## 2015-07-17 MED ORDER — MORPHINE SULFATE (PF) 2 MG/ML IV SOLN
1.0000 mg | INTRAVENOUS | Status: DC | PRN
  Administered 2015-07-17 (×2): 2 mg via INTRAVENOUS
  Filled 2015-07-17: qty 1

## 2015-07-17 MED ORDER — SENNA 8.6 MG PO TABS
1.0000 | ORAL_TABLET | Freq: Two times a day (BID) | ORAL | Status: DC
  Administered 2015-07-17: 8.6 mg via ORAL
  Filled 2015-07-17: qty 1

## 2015-07-17 MED ORDER — DEXAMETHASONE SODIUM PHOSPHATE 4 MG/ML IJ SOLN
INTRAMUSCULAR | Status: DC | PRN
  Administered 2015-07-17: 10 mg via INTRAVENOUS

## 2015-07-17 MED ORDER — ARTIFICIAL TEARS OP OINT
TOPICAL_OINTMENT | OPHTHALMIC | Status: AC
  Filled 2015-07-17: qty 3.5

## 2015-07-17 MED ORDER — DEXAMETHASONE SODIUM PHOSPHATE 10 MG/ML IJ SOLN
10.0000 mg | INTRAMUSCULAR | Status: DC
  Filled 2015-07-17: qty 1

## 2015-07-17 SURGICAL SUPPLY — 48 items
BAG DECANTER FOR FLEXI CONT (MISCELLANEOUS) ×3 IMPLANT
BENZOIN TINCTURE PRP APPL 2/3 (GAUZE/BANDAGES/DRESSINGS) ×3 IMPLANT
BUR MATCHSTICK NEURO 3.0 LAGG (BURR) ×3 IMPLANT
CAGE COROENT SM 6X13X15 (Cage) ×3 IMPLANT
CAGE SMALL 7X13X15 (Cage) ×3 IMPLANT
CANISTER SUCT 3000ML PPV (MISCELLANEOUS) ×3 IMPLANT
CLOSURE WOUND 1/2 X4 (GAUZE/BANDAGES/DRESSINGS) ×1
DRAPE C-ARM 42X72 X-RAY (DRAPES) ×6 IMPLANT
DRAPE LAPAROTOMY 100X72 PEDS (DRAPES) ×3 IMPLANT
DRAPE MICROSCOPE LEICA (MISCELLANEOUS) ×3 IMPLANT
DRAPE POUCH INSTRU U-SHP 10X18 (DRAPES) ×3 IMPLANT
DRILL BIT HELIX (BIT) ×3 IMPLANT
DRSG OPSITE POSTOP 3X4 (GAUZE/BANDAGES/DRESSINGS) ×3 IMPLANT
DURAPREP 6ML APPLICATOR 50/CS (WOUND CARE) ×3 IMPLANT
ELECT COATED BLADE 2.86 ST (ELECTRODE) ×3 IMPLANT
ELECT REM PT RETURN 9FT ADLT (ELECTROSURGICAL) ×3
ELECTRODE REM PT RTRN 9FT ADLT (ELECTROSURGICAL) ×1 IMPLANT
GAUZE SPONGE 4X4 16PLY XRAY LF (GAUZE/BANDAGES/DRESSINGS) IMPLANT
GLOVE BIO SURGEON STRL SZ8 (GLOVE) ×6 IMPLANT
GLOVE BIOGEL PI IND STRL 8.5 (GLOVE) ×1 IMPLANT
GLOVE BIOGEL PI INDICATOR 8.5 (GLOVE) ×2
GLOVE ECLIPSE 7.5 STRL STRAW (GLOVE) ×3 IMPLANT
GLOVE INDICATOR 7.0 STRL GRN (GLOVE) ×9 IMPLANT
GLOVE INDICATOR 7.5 STRL GRN (GLOVE) ×3 IMPLANT
GOWN STRL REUS W/ TWL LRG LVL3 (GOWN DISPOSABLE) ×2 IMPLANT
GOWN STRL REUS W/ TWL XL LVL3 (GOWN DISPOSABLE) ×2 IMPLANT
GOWN STRL REUS W/TWL 2XL LVL3 (GOWN DISPOSABLE) IMPLANT
GOWN STRL REUS W/TWL LRG LVL3 (GOWN DISPOSABLE) ×4
GOWN STRL REUS W/TWL XL LVL3 (GOWN DISPOSABLE) ×4
HEMOSTAT POWDER KIT SURGIFOAM (HEMOSTASIS) ×3 IMPLANT
KIT BASIN OR (CUSTOM PROCEDURE TRAY) ×3 IMPLANT
KIT ROOM TURNOVER OR (KITS) ×3 IMPLANT
NEEDLE HYPO 25X1 1.5 SAFETY (NEEDLE) ×3 IMPLANT
NEEDLE SPNL 20GX3.5 QUINCKE YW (NEEDLE) ×3 IMPLANT
NS IRRIG 1000ML POUR BTL (IV SOLUTION) ×3 IMPLANT
PACK LAMINECTOMY NEURO (CUSTOM PROCEDURE TRAY) ×3 IMPLANT
PAD ARMBOARD 7.5X6 YLW CONV (MISCELLANEOUS) ×9 IMPLANT
PLATE ARCHON 2-LEVEL 36MM (Plate) ×3 IMPLANT
RUBBERBAND STERILE (MISCELLANEOUS) ×6 IMPLANT
SCREW ARCHON SELFTAP 4.0X13 (Screw) ×18 IMPLANT
SPONGE INTESTINAL PEANUT (DISPOSABLE) ×3 IMPLANT
SPONGE SURGIFOAM ABS GEL SZ50 (HEMOSTASIS) ×3 IMPLANT
STRIP CLOSURE SKIN 1/2X4 (GAUZE/BANDAGES/DRESSINGS) ×2 IMPLANT
SUT VIC AB 3-0 SH 8-18 (SUTURE) ×6 IMPLANT
TOWEL OR 17X24 6PK STRL BLUE (TOWEL DISPOSABLE) ×3 IMPLANT
TOWEL OR 17X26 10 PK STRL BLUE (TOWEL DISPOSABLE) ×3 IMPLANT
TRAP SPECIMEN MUCOUS 40CC (MISCELLANEOUS) ×3 IMPLANT
WATER STERILE IRR 1000ML POUR (IV SOLUTION) ×3 IMPLANT

## 2015-07-17 NOTE — Anesthesia Postprocedure Evaluation (Signed)
Anesthesia Post Note  Patient: Victoria Allen  Procedure(s) Performed: Procedure(s) (LRB): Anterior Cervical Decompression Fusion Cervical five-six,Cervical six-seven  (N/A)  Anesthesia type: General  Patient location: PACU  Post pain: Pain level controlled  Post assessment: Post-op Vital signs reviewed  Last Vitals: BP 119/67 mmHg  Pulse 81  Temp(Src) 37.1 C (Oral)  Resp 12  Ht 5\' 2"  (1.575 m)  Wt 133 lb 4.8 oz (60.464 kg)  BMI 24.37 kg/m2  SpO2 100%  Post vital signs: Reviewed  Level of consciousness: sedated  Complications: No apparent anesthesia complications

## 2015-07-17 NOTE — Anesthesia Procedure Notes (Signed)
Procedure Name: Intubation Date/Time: 07/17/2015 12:17 PM Performed by: Maryland Pink Pre-anesthesia Checklist: Patient identified, Emergency Drugs available, Suction available, Patient being monitored and Timeout performed Patient Re-evaluated:Patient Re-evaluated prior to inductionOxygen Delivery Method: Circle system utilized Preoxygenation: Pre-oxygenation with 100% oxygen Intubation Type: IV induction Ventilation: Mask ventilation without difficulty Laryngoscope Size: Mac and 3 Grade View: Grade I Tube type: Oral Tube size: 7.5 mm Number of attempts: 1 Airway Equipment and Method: Stylet Placement Confirmation: ETT inserted through vocal cords under direct vision,  positive ETCO2 and breath sounds checked- equal and bilateral Secured at: 22 cm Tube secured with: Tape Dental Injury: Teeth and Oropharynx as per pre-operative assessment

## 2015-07-17 NOTE — H&P (Signed)
Subjective:   Patient is a 49 y.o. female admitted for neck and right arm pain. The patient first presented to me with complaints of neck pain and shooting pains in the arm(s). Onset of symptoms was several months ago. The pain is described as sharp and occurs all day. The pain is rated severe, and is located  base of the neck and radiates to the right upper extremity. The symptoms have been progressive. Symptoms are exacerbated by extending head backwards, and are relieved by none.  Previous work up includes MRI of cervical spine, results: spinal stenosis.  Past Medical History  Diagnosis Date  . Hypertension   . Allergic rhinitis   . Fibroids     with dysmenorrhea  . Dysmenorrhea   . Varicella infection   . Herniated disc   . Anemia     Past Surgical History  Procedure Laterality Date  . Abdominal hysterectomy    . Nasal sinus surgery      Allergies  Allergen Reactions  . Codeine Itching  . Penicillins Hives    Has patient had a PCN reaction causing immediate rash, facial/tongue/throat swelling, SOB or lightheadedness with hypotension: No Has patient had a PCN reaction causing severe rash involving mucus membranes or skin necrosis: No Has patient had a PCN reaction that required hospitalization No Has patient had a PCN reaction occurring within the last 10 years: No If all of the above answers are "NO", then may proceed with Cephalosporin use.     Social History  Substance Use Topics  . Smoking status: Never Smoker   . Smokeless tobacco: Not on file  . Alcohol Use: Yes     Comment: occasionally    Family History  Problem Relation Age of Onset  . Hypertension Mother   . Heart attack Father   . Cancer - Prostate Father   . Hypertension Father   . Diabetes Father   . Hypertension Maternal Grandmother   . Cancer - Colon Maternal Grandmother   . Hypertension Paternal Grandmother   . Heart attack Paternal Grandmother    Prior to Admission medications   Medication Sig  Start Date End Date Taking? Authorizing Provider  fexofenadine (ALLEGRA) 180 MG tablet Take 180 mg by mouth daily.   Yes Historical Provider, MD  ibuprofen (ADVIL,MOTRIN) 200 MG tablet Take 800 mg by mouth every 8 (eight) hours as needed for mild pain.    Yes Historical Provider, MD  Olmesartan-Amlodipine-HCTZ (TRIBENZOR) 40-5-25 MG TABS Take 1 tablet by mouth daily.   Yes Historical Provider, MD  diazepam (VALIUM) 5 MG tablet Take 1 tablet (5 mg total) by mouth every 6 (six) hours as needed (vertigo). 09/02/12   Virgel Manifold, MD  meclizine (ANTIVERT) 25 MG tablet Take 1 tablet (25 mg total) by mouth 3 (three) times daily as needed for dizziness (vertigo). 09/02/12   Virgel Manifold, MD     Review of Systems  Positive ROS: Negative  All other systems have been reviewed and were otherwise negative with the exception of those mentioned in the HPI and as above.  Objective: Vital signs in last 24 hours: Temp:  [98.3 F (36.8 C)] 98.3 F (36.8 C) (11/16 0945) Pulse Rate:  [96] 96 (11/16 0945) Resp:  [18] 18 (11/16 0945) BP: (133)/(85) 133/85 mmHg (11/16 0945) SpO2:  [100 %] 100 % (11/16 0945) Weight:  [60.464 kg (133 lb 4.8 oz)] 60.464 kg (133 lb 4.8 oz) (11/16 0945)  General Appearance: Alert, cooperative, no distress, appears stated age Head: Normocephalic,  without obvious abnormality, atraumatic Eyes: PERRL, conjunctiva/corneas clear, EOM's intact      Neck: Supple, symmetrical, trachea midline, Back: Symmetric, no curvature, ROM normal, no CVA tenderness Lungs:  respirations unlabored Heart: Regular rate and rhythm Abdomen: Soft, non-tender Extremities: Extremities normal, atraumatic, no cyanosis or edema Pulses: 2+ and symmetric all extremities Skin: Skin color, texture, turgor normal, no rashes or lesions  NEUROLOGIC:  Mental status: Alert and oriented x4, no aphasia, good attention span, fund of knowledge and memory  Motor Exam - grossly normal Sensory Exam - grossly  normal Reflexes: 1+ Coordination - grossly normal Gait - grossly normal Balance - grossly normal Cranial Nerves: I: smell Not tested  II: visual acuity  OS: nl    OD: nl  II: visual fields Full to confrontation  II: pupils Equal, round, reactive to light  III,VII: ptosis None  III,IV,VI: extraocular muscles  Full ROM  V: mastication Normal  V: facial light touch sensation  Normal  V,VII: corneal reflex  Present  VII: facial muscle function - upper  Normal  VII: facial muscle function - lower Normal  VIII: hearing Not tested  IX: soft palate elevation  Normal  IX,X: gag reflex Present  XI: trapezius strength  5/5  XI: sternocleidomastoid strength 5/5  XI: neck flexion strength  5/5  XII: tongue strength  Normal    Data Review Lab Results  Component Value Date   WBC 11.4* 07/09/2015   HGB 14.5 07/09/2015   HCT 44.8 07/09/2015   MCV 86.7 07/09/2015   PLT 242 07/09/2015   Lab Results  Component Value Date   NA 137 07/09/2015   K 3.3* 07/09/2015   CL 98* 07/09/2015   CO2 31 07/09/2015   BUN 11 07/09/2015   CREATININE 0.77 07/09/2015   GLUCOSE 100* 07/09/2015   Lab Results  Component Value Date   INR 1.03 07/09/2015    Assessment:   Cervical neck pain with herniated nucleus pulposus/ spondylosis/ stenosis at C5-6 C6-7. Patient has failed conservative therapy. Planned surgery : ACDF footplate C5-6 [id number]  Plan:   I explained the condition and procedure to the patient and answered any questions.  Patient wishes to proceed with procedure as planned. Understands risks/ benefits/ and expected or typical outcomes.  JONES,DAVID S 07/17/2015 11:57 AM

## 2015-07-17 NOTE — Op Note (Signed)
07/17/2015  2:25 PM  PATIENT:  Victoria Allen  49 y.o. female  PRE-OPERATIVE DIAGNOSIS:  Cervical spondylosis with disc herniation C5-6 C6-7 with neck and right arm pain  POST-OPERATIVE DIAGNOSIS:  Same  PROCEDURE:  1. Decompressive anterior cervical discectomy C5-6 C6-7, 2. Anterior arthrodesis C5-6 C6-7 utilizing peek interbody cages packed with morcellized  Autograft, 3. Anterior cervical plating C5-C7 inclusive utilizing a nuvasive archon plate  SURGEON:  Sherley Bounds, MD  ASSISTANTS: Dr. Saintclair Halsted  ANESTHESIA:   General  EBL: Less than 25 ml  Total I/O In: 1000 [I.V.:1000] Out: -   BLOOD ADMINISTERED:none  DRAINS: None   SPECIMEN:  No Specimen  INDICATION FOR PROCEDURE: This patient presented with severe neck and right arm pain. MRI showed cervical spondylosis with cervical disc herniation C5-6 C6-7. She tried medical management without relief. I recommended ACDF with plating. Patient understood the risks, benefits, and alternatives and potential outcomes and wished to proceed.  PROCEDURE DETAILS: Patient was brought to the operating room placed under general endotracheal anesthesia. Patient was placed in the supine position on the operating room table. The neck was prepped with Duraprep and draped in a sterile fashion.   Three cc of local anesthesia was injected and a transverse incision was made on the right side of the neck.  Dissection was carried down thru the subcutaneous tissue and the platysma was  elevated, opened, and undermined with Metzenbaum scissors.  Dissection was then carried out thru an avascular plane leaving the sternocleidomastoid carotid artery and jugular vein laterally and the trachea and esophagus medially. The ventral aspect of the vertebral column was identified and a localizing x-ray was taken. The C5-6 level was identified. The longus colli muscles were then elevated and the retractor was placed to expose C5-6 and C6-7. The annulus was incised and  the disc space entered. Discectomy was performed with micro-curettes and pituitary rongeurs. I then used the high-speed drill to drill the endplates down to the level of the posterior longitudinal ligament. The drill shavings were saved in a mucous trap for later arthrodesis. The operating microscope was draped and brought into the field provided additional magnification, illumination and visualization. Discectomy was continued posteriorly thru the disc space. Posterior longitudinal ligament was opened with a nerve hook, and then removed along with disc herniation and osteophytes, decompressing the spinal canal and thecal sac. We then continued to remove osteophytic overgrowth and disc material decompressing the neural foramina and exiting nerve roots bilaterally. The scope was angled up and down to help decompress and undercut the vertebral bodies. Once the decompression was completed we could pass a nerve hook circumferentially to assure adequate decompression in the midline and in the neural foramina. So by both visualization and palpation we felt we had an adequate decompression of the neural elements at both levels. We then measured the height of the intravertebral disc space and selected a 7 millimeter Peek interbody cage packed with autograft and morcellized allograft. It was then gently positioned in the intravertebral disc space and countersunk at both levels. I then used a 36 mm plate and placed  variable angle screws into the vertebral bodies and locked them into position. The wound was irrigated with bacitracin solution, checked for hemostasis which was established and confirmed. Once meticulous hemostasis was achieved, we then proceeded with closure. The platysma was closed with interrupted 3-0 undyed Vicryl suture, the subcuticular layer was closed with interrupted 3-0 undyed Vicryl suture. The skin edges were approximated with steristrips. The drapes were removed.  A sterile dressing was applied. The  patient was then awakened from general anesthesia and transferred to the recovery room in stable condition. At the end of the procedure all sponge, needle and instrument counts were correct.   PLAN OF CARE: Admit for overnight observation  PATIENT DISPOSITION:  PACU - hemodynamically stable.   Delay start of Pharmacological VTE agent (>24hrs) due to surgical blood loss or risk of bleeding:  yes

## 2015-07-17 NOTE — Anesthesia Preprocedure Evaluation (Signed)
Anesthesia Evaluation  Patient identified by MRN, date of birth, ID band Patient awake    Reviewed: Allergy & Precautions, NPO status , Patient's Chart, lab work & pertinent test results  Airway Mallampati: II  TM Distance: >3 FB Neck ROM: Full    Dental no notable dental hx.    Pulmonary neg pulmonary ROS,    Pulmonary exam normal breath sounds clear to auscultation       Cardiovascular hypertension, Pt. on medications Normal cardiovascular exam Rhythm:Regular Rate:Normal     Neuro/Psych negative neurological ROS  negative psych ROS   GI/Hepatic negative GI ROS, Neg liver ROS,   Endo/Other  negative endocrine ROS  Renal/GU negative Renal ROS     Musculoskeletal negative musculoskeletal ROS (+)   Abdominal   Peds  Hematology  (+) anemia ,   Anesthesia Other Findings   Reproductive/Obstetrics                             Anesthesia Physical Anesthesia Plan  ASA: II  Anesthesia Plan: General   Post-op Pain Management:    Induction: Intravenous  Airway Management Planned: Oral ETT  Additional Equipment:   Intra-op Plan:   Post-operative Plan: Extubation in OR  Informed Consent: I have reviewed the patients History and Physical, chart, labs and discussed the procedure including the risks, benefits and alternatives for the proposed anesthesia with the patient or authorized representative who has indicated his/her understanding and acceptance.   Dental advisory given  Plan Discussed with: CRNA  Anesthesia Plan Comments:         Anesthesia Quick Evaluation

## 2015-07-17 NOTE — Transfer of Care (Signed)
Immediate Anesthesia Transfer of Care Note  Patient: Victoria Allen  Procedure(s) Performed: Procedure(s): Anterior Cervical Decompression Fusion Cervical five-six,Cervical six-seven  (N/A)  Patient Location: PACU  Anesthesia Type:General  Level of Consciousness: patient cooperative and responds to stimulation  Airway & Oxygen Therapy: Patient Spontanous Breathing and Patient connected to nasal cannula oxygen  Post-op Assessment: Report given to RN, Post -op Vital signs reviewed and stable and Patient moving all extremities X 4  Post vital signs: Reviewed and stable  Last Vitals:  Filed Vitals:   07/17/15 0945  BP: 133/85  Pulse: 96  Temp: 36.8 C  Resp: 18    Complications: No apparent anesthesia complications

## 2015-07-18 ENCOUNTER — Encounter (HOSPITAL_COMMUNITY): Payer: Self-pay | Admitting: Neurological Surgery

## 2015-07-18 DIAGNOSIS — M47812 Spondylosis without myelopathy or radiculopathy, cervical region: Secondary | ICD-10-CM | POA: Diagnosis not present

## 2015-07-18 MED ORDER — OXYCODONE-ACETAMINOPHEN 5-325 MG PO TABS: 1.0000 | ORAL_TABLET | ORAL | Status: DC | PRN

## 2015-07-18 NOTE — Progress Notes (Signed)
Patient alert and oriented, mae's well, voiding adequate amount of urine, swallowing without difficulty, no c/o pain. Patient discharged home with family. Script and discharged instructions given to patient. Patient and family stated understanding of d/c instructions given and has an appointment with MD. 

## 2015-07-18 NOTE — Discharge Summary (Signed)
Physician Discharge Summary  Patient ID: Victoria Allen MRN: [id number] DOB/AGE: Feb 23, 1966 49 y.o.  Admit date: 07/17/2015 Discharge date: 07/18/2015  Admission Diagnoses: cervical spondylosis   Discharge Diagnoses: same   Discharged Condition: good  Hospital Course: The patient was admitted on 07/17/2015 and taken to the operating room where the patient underwent ACDF. The patient tolerated the procedure well and was taken to the recovery room and then to the floor in stable condition. The hospital course was routine. There were no complications. The wound remained clean dry and intact. Pt had appropriate neck soreness. No complaints of arm pain or new N/T/W. The patient remained afebrile with stable vital signs, and tolerated a regular diet. The patient continued to increase activities, and pain was well controlled with oral pain medications.   Consults: None  Significant Diagnostic Studies:  Results for orders placed or performed during the hospital encounter of 07/09/15  Surgical pcr screen  Result Value Ref Range   MRSA, PCR NEGATIVE NEGATIVE   Staphylococcus aureus NEGATIVE NEGATIVE  Basic metabolic panel  Result Value Ref Range   Sodium 137 135 - 145 mmol/L   Potassium 3.3 (L) 3.5 - 5.1 mmol/L   Chloride 98 (L) 101 - 111 mmol/L   CO2 31 22 - 32 mmol/L   Glucose, Bld 100 (H) 65 - 99 mg/dL   BUN 11 6 - 20 mg/dL   Creatinine, Ser 0.77 0.44 - 1.00 mg/dL   Calcium 9.6 8.9 - 10.3 mg/dL   GFR calc non Af Amer >60 >60 mL/min   GFR calc Af Amer >60 >60 mL/min   Anion gap 8 5 - 15  CBC WITH DIFFERENTIAL  Result Value Ref Range   WBC 11.4 (H) 4.0 - 10.5 K/uL   RBC 5.17 (H) 3.87 - 5.11 MIL/uL   Hemoglobin 14.5 12.0 - 15.0 g/dL   HCT 44.8 36.0 - 46.0 %   MCV 86.7 78.0 - 100.0 fL   MCH 28.0 26.0 - 34.0 pg   MCHC 32.4 30.0 - 36.0 g/dL   RDW 12.9 11.5 - 15.5 %   Platelets 242 150 - 400 K/uL   Neutrophils Relative % 65 %   Neutro Abs 7.4 1.7 - 7.7 K/uL   Lymphocytes  Relative 27 %   Lymphs Abs 3.1 0.7 - 4.0 K/uL   Monocytes Relative 7 %   Monocytes Absolute 0.8 0.1 - 1.0 K/uL   Eosinophils Relative 1 %   Eosinophils Absolute 0.1 0.0 - 0.7 K/uL   Basophils Relative 0 %   Basophils Absolute 0.0 0.0 - 0.1 K/uL  Protime-INR  Result Value Ref Range   Prothrombin Time 13.7 11.6 - 15.2 seconds   INR 1.03 0.00 - 1.49    Dg Cervical Spine 1 View  07/17/2015  CLINICAL DATA:  Cervical disc herniation. Status post cervical spine fusion. EXAM: DG C-ARM 61-120 MIN; DG CERVICAL SPINE - 1 VIEW COMPARISON:  None. FINDINGS: Interbody and anterior cervical spine fusion hardware seen in expected position from levels of C5-C7. Alignment is normal these levels. Mild degenerative vertebral osteophyte formation seen at C4-5 without significant disc space narrowing. IMPRESSION: Status post anterior cervical spine fusion from C5-C7 with normal alignment. Electronically Signed   By: Earle Gell M.D.   On: 07/17/2015 14:23   Dg C-arm 1-60 Min  07/17/2015  CLINICAL DATA:  Cervical disc herniation. Status post cervical spine fusion. EXAM: DG C-ARM 61-120 MIN; DG CERVICAL SPINE - 1 VIEW COMPARISON:  None. FINDINGS: Interbody and anterior cervical  spine fusion hardware seen in expected position from levels of C5-C7. Alignment is normal these levels. Mild degenerative vertebral osteophyte formation seen at C4-5 without significant disc space narrowing. IMPRESSION: Status post anterior cervical spine fusion from C5-C7 with normal alignment. Electronically Signed   By: Earle Gell M.D.   On: 07/17/2015 14:23    Antibiotics:  Anti-infectives    Start     Dose/Rate Route Frequency Ordered Stop   07/18/15 0000  vancomycin (VANCOCIN) IVPB 1000 mg/200 mL premix     1,000 mg 200 mL/hr over 60 Minutes Intravenous  Once 07/17/15 1657 07/18/15 0020   07/17/15 1200  bacitracin 50,000 Units in sodium chloride irrigation 0.9 % 500 mL irrigation  Status:  Discontinued       As needed 07/17/15  1300 07/17/15 1413   07/17/15 0941  vancomycin (VANCOCIN) IVPB 1000 mg/200 mL premix     1,000 mg 200 mL/hr over 60 Minutes Intravenous On call to O.R. 07/17/15 0941 07/17/15 1317   07/17/15 0941  vancomycin (VANCOCIN) 1 GM/200ML IVPB    Comments:  Laurita Quint   : cabinet override      07/17/15 0941 07/17/15 2144      Discharge Exam: Blood pressure 118/70, pulse 94, temperature 98.3 F (36.8 C), temperature source Oral, resp. rate 18, height 5\' 2"  (1.575 m), weight 60.464 kg (133 lb 4.8 oz), SpO2 99 %. Neurologic: Grossly normal Incision CDI  Discharge Medications:     Medication List    STOP taking these medications        ibuprofen 200 MG tablet  Commonly known as:  ADVIL,MOTRIN      TAKE these medications        diazepam 5 MG tablet  Commonly known as:  VALIUM  Take 1 tablet (5 mg total) by mouth every 6 (six) hours as needed (vertigo).     fexofenadine 180 MG tablet  Commonly known as:  ALLEGRA  Take 180 mg by mouth daily.     meclizine 25 MG tablet  Commonly known as:  ANTIVERT  Take 1 tablet (25 mg total) by mouth 3 (three) times daily as needed for dizziness (vertigo).     oxyCODONE-acetaminophen 5-325 MG tablet  Commonly known as:  PERCOCET/ROXICET  Take 1-2 tablets by mouth every 4 (four) hours as needed for moderate pain.     TRIBENZOR 40-5-25 MG Tabs  Generic drug:  Olmesartan-Amlodipine-HCTZ  Take 1 tablet by mouth daily.        Disposition: home   Final Dx: ACDF      Discharge Instructions     Remove dressing in 72 hours    Complete by:  As directed      Call MD for:  difficulty breathing, headache or visual disturbances    Complete by:  As directed      Call MD for:  persistant nausea and vomiting    Complete by:  As directed      Call MD for:  redness, tenderness, or signs of infection (pain, swelling, redness, odor or green/yellow discharge around incision site)    Complete by:  As directed      Call MD for:  severe uncontrolled pain     Complete by:  As directed      Call MD for:  temperature >100.4    Complete by:  As directed      Diet - low sodium heart healthy    Complete by:  As directed      Discharge  instructions    Complete by:  As directed   May shower, no heavy lifting, no driving for a few days     Increase activity slowly    Complete by:  As directed               Signed: Qadir Folks S 07/18/2015, 9:26 AM

## 2015-11-04 ENCOUNTER — Encounter (HOSPITAL_COMMUNITY): Payer: Self-pay | Admitting: Emergency Medicine

## 2015-11-04 ENCOUNTER — Emergency Department (HOSPITAL_COMMUNITY)
Admission: EM | Admit: 2015-11-04 | Discharge: 2015-11-04 | Disposition: A | Discharge status: Home / Self Care | Payer: 59 | Attending: Emergency Medicine | Admitting: Emergency Medicine

## 2015-11-04 DIAGNOSIS — Z86018 Personal history of other benign neoplasm: Secondary | ICD-10-CM | POA: Diagnosis not present

## 2015-11-04 DIAGNOSIS — Z8742 Personal history of other diseases of the female genital tract: Secondary | ICD-10-CM | POA: Diagnosis not present

## 2015-11-04 DIAGNOSIS — R42 Dizziness and giddiness: Secondary | ICD-10-CM | POA: Diagnosis not present

## 2015-11-04 DIAGNOSIS — Z79899 Other long term (current) drug therapy: Secondary | ICD-10-CM | POA: Insufficient documentation

## 2015-11-04 DIAGNOSIS — Z8619 Personal history of other infectious and parasitic diseases: Secondary | ICD-10-CM | POA: Insufficient documentation

## 2015-11-04 DIAGNOSIS — IMO0001 Reserved for inherently not codable concepts without codable children: Secondary | ICD-10-CM

## 2015-11-04 DIAGNOSIS — Z880 Allergy status to penicillin: Secondary | ICD-10-CM | POA: Diagnosis not present

## 2015-11-04 DIAGNOSIS — Z8739 Personal history of other diseases of the musculoskeletal system and connective tissue: Secondary | ICD-10-CM | POA: Insufficient documentation

## 2015-11-04 DIAGNOSIS — R030 Elevated blood-pressure reading, without diagnosis of hypertension: Secondary | ICD-10-CM

## 2015-11-04 DIAGNOSIS — Z862 Personal history of diseases of the blood and blood-forming organs and certain disorders involving the immune mechanism: Secondary | ICD-10-CM | POA: Insufficient documentation

## 2015-11-04 DIAGNOSIS — I10 Essential (primary) hypertension: Secondary | ICD-10-CM | POA: Diagnosis not present

## 2015-11-04 LAB — URINE MICROSCOPIC-ADD ON

## 2015-11-04 LAB — BASIC METABOLIC PANEL
Anion gap: 10 (ref 5–15)
BUN: 11 mg/dL (ref 6–20)
CO2: 26 mmol/L (ref 22–32)
Calcium: 9.4 mg/dL (ref 8.9–10.3)
Chloride: 97 mmol/L — ABNORMAL LOW (ref 101–111)
Creatinine, Ser: 0.62 mg/dL (ref 0.44–1.00)
GFR calc Af Amer: >60 mL/min (ref >60)
GFR calc non Af Amer: >60 mL/min (ref >60)
Glucose, Bld: 110 mg/dL — ABNORMAL HIGH (ref 65–99)
Potassium: 2.8 mmol/L — ABNORMAL LOW (ref 3.5–5.1)
Sodium: 133 mmol/L — ABNORMAL LOW (ref 135–145)

## 2015-11-04 LAB — CBC
HCT: 42.7 % (ref 36.0–46.0)
Hemoglobin: 13.8 g/dL (ref 12.0–15.0)
MCH: 28 pg (ref 26.0–34.0)
MCHC: 32.3 g/dL (ref 30.0–36.0)
MCV: 86.6 fL (ref 78.0–100.0)
Platelets: 229 10*3/uL (ref 150–400)
RBC: 4.93 MIL/uL (ref 3.87–5.11)
RDW: 12.7 % (ref 11.5–15.5)
WBC: 8.9 10*3/uL (ref 4.0–10.5)

## 2015-11-04 LAB — URINALYSIS, ROUTINE W REFLEX MICROSCOPIC
Bilirubin Urine: NEGATIVE
Glucose, UA: NEGATIVE mg/dL
Ketones, ur: NEGATIVE mg/dL
Leukocytes, UA: NEGATIVE
Nitrite: NEGATIVE
Protein, ur: NEGATIVE mg/dL
Specific Gravity, Urine: 1.011 (ref 1.005–1.030)
pH: 7.5 (ref 5.0–8.0)

## 2015-11-04 MED ORDER — SODIUM CHLORIDE 0.9 % IV BOLUS (SEPSIS)
1000.0000 mL | Freq: Once | INTRAVENOUS | Status: AC
  Administered 2015-11-04: 1000 mL via INTRAVENOUS

## 2015-11-04 MED ORDER — MECLIZINE HCL 25 MG PO TABS
25.0000 mg | ORAL_TABLET | Freq: Once | ORAL | Status: AC
  Administered 2015-11-04: 25 mg via ORAL
  Filled 2015-11-04: qty 1

## 2015-11-04 MED ORDER — POTASSIUM CHLORIDE CRYS ER 20 MEQ PO TBCR
40.0000 meq | EXTENDED_RELEASE_TABLET | Freq: Once | ORAL | Status: AC
  Administered 2015-11-04: 40 meq via ORAL
  Filled 2015-11-04: qty 2

## 2015-11-04 NOTE — ED Notes (Signed)
Pt stated unable to give urine sample at this time 

## 2015-11-04 NOTE — ED Notes (Addendum)
Pt states she has a hx of vertigo and is dizzy, HA and minimally nauseated at this moment. Pt requesting to eat at this time.

## 2015-11-04 NOTE — Discharge Instructions (Signed)
1. Medications: continue usual home medications 2. Treatment: rest, drink plenty of fluids, follow a low sodium diet.  3. Follow Up: Please follow up with your primary doctor at your scheduled appointment for discussion of your diagnoses and further evaluation after today's visit; Please return to the ER for new or worsening symptoms, any additional concerns.

## 2015-11-04 NOTE — ED Provider Notes (Signed)
CSN: [id number]     Arrival date & time 11/04/15  1400 History   First MD Initiated Contact with Patient 11/04/15 1855     Chief Complaint  Patient presents with  . Hypertension  . Dizziness     (Consider location/radiation/quality/duration/timing/severity/associated sxs/prior Treatment) Patient is a 50 y.o. female presenting with hypertension and dizziness. The history is provided by the patient and medical records. No language interpreter was used.  Hypertension Pertinent negatives include no abdominal pain, chills, congestion, coughing, fever, headaches, nausea, neck pain, rash, sore throat or vomiting.  Dizziness Associated symptoms: no headaches, no nausea, no shortness of breath and no vomiting     Victoria Allen is a 50 y.o. female  with a PMH of HTN and vertigo who presents to the Emergency Department for dizziness and elevated blood pressure. Patient states she awoke this morning with dizziness described as "room spinning", worse with movement and c/w previous episodes of vertigo and nausea. When she went to work, she checked her blood pressure which she realized was elevated "in the 170's" Patient is on [place] for hypertension, stating that she took her medication today and has been compliant with this medication. She has not been seen by primary physician or had meds adjusted since last year, stating that her annual exam is on Wednesday, March 15. Patient denies associated visual changes, headaches, chest pain, shortness breath. No medication taken prior to arrival.  Past Medical History  Diagnosis Date  . Hypertension   . Allergic rhinitis   . Fibroids     with dysmenorrhea  . Dysmenorrhea   . Varicella infection   . Herniated disc   . Anemia    Past Surgical History  Procedure Laterality Date  . Abdominal hysterectomy    . Nasal sinus surgery    . Anterior cervical decomp/discectomy fusion N/A 07/17/2015    Procedure: Anterior Cervical Decompression Fusion  Cervical five-six,Cervical six-seven ;  Surgeon: Eustace Moore, MD;  Location: [facility];  Service: Neurosurgery;  Laterality: N/A;   Family History  Problem Relation Age of Onset  . Hypertension Mother   . Heart attack Father   . Cancer - Prostate Father   . Hypertension Father   . Diabetes Father   . Hypertension Maternal Grandmother   . Cancer - Colon Maternal Grandmother   . Hypertension Paternal Grandmother   . Heart attack Paternal Grandmother    Social History  Substance Use Topics  . Smoking status: Never Smoker   . Smokeless tobacco: None  . Alcohol Use: Yes     Comment: occasionally   OB History    No data available     Review of Systems  Constitutional: Negative for fever and chills.  HENT: Negative for congestion and sore throat.   Eyes: Negative for visual disturbance.  Respiratory: Negative for cough, shortness of breath and wheezing.   Cardiovascular: Negative.   Gastrointestinal: Negative for nausea, vomiting and abdominal pain.  Genitourinary: Negative for dysuria.  Musculoskeletal: Negative for back pain and neck pain.  Skin: Negative for rash.  Neurological: Positive for dizziness. Negative for headaches.      Allergies  Codeine and Penicillins  Home Medications   Prior to Admission medications   Medication Sig Start Date End Date Taking? Authorizing Provider  fexofenadine (ALLEGRA) 180 MG tablet Take 180 mg by mouth daily.   Yes Historical Provider, MD  Olmesartan-Amlodipine-HCTZ (TRIBENZOR) 40-5-25 MG TABS Take 1 tablet by mouth daily.   Yes Historical Provider, MD  diazepam (VALIUM) 5 MG tablet Take 1 tablet (5 mg total) by mouth every 6 (six) hours as needed (vertigo). Patient not taking: Reported on 11/04/2015 09/02/12   Virgel Manifold, MD  oxyCODONE-acetaminophen (PERCOCET/ROXICET) 5-325 MG tablet Take 1-2 tablets by mouth every 4 (four) hours as needed for moderate pain. Patient not taking: Reported on 11/04/2015 07/18/15   Eustace Moore, MD    BP 147/98 mmHg  Pulse 72  Temp(Src) 98.1 F (36.7 C) (Oral)  Resp 16  SpO2 100% Physical Exam  Constitutional: She is oriented to person, place, and time. She appears well-developed and well-nourished.  Alert and in no acute distress  HENT:  Head: Normocephalic and atraumatic.  Cardiovascular: Normal rate, regular rhythm and normal heart sounds.  Exam reveals no gallop and no friction rub.   No murmur heard. Pulmonary/Chest: Effort normal and breath sounds normal. No respiratory distress. She has no wheezes. She has no rales.  Abdominal: Soft. She exhibits no distension. There is no tenderness.  Musculoskeletal: She exhibits no edema.  Neurological: She is alert and oriented to person, place, and time.  Alert, oriented, thought content appropriate, able to give a coherent history. Speech is clear and goal oriented, able to follow commands.  Cranial Nerves:  II:  Peripheral visual fields grossly normal, pupils equal, round, reactive to light III, IV, VI: EOM intact bilaterally, ptosis not present V,VII: smile symmetric, eyes kept closed tightly against resistance, facial light touch sensation equal VIII: hearing grossly normal IX, X: symmetric soft palate movement, uvula elevates symmetrically  XI: bilateral shoulder shrug symmetric and strong XII: midline tongue extension 5/5 muscle strength in upper and lower extremities bilaterally including strong and equal grip strength and dorsiflexion/plantar flexion Sensory to light touch normal in all four extremities.  Normal finger-to-nose and rapid alternating movements.  Skin: Skin is warm and dry.  Psychiatric: She has a normal mood and affect. Her behavior is normal. Judgment and thought content normal.  Nursing note and vitals reviewed.   ED Course  Procedures (including critical care time) Labs Review Labs Reviewed  BASIC METABOLIC PANEL - Abnormal; Notable for the following:    Sodium 133 (*)    Potassium 2.8 (*)     Chloride 97 (*)    Glucose, Bld 110 (*)    All other components within normal limits  URINALYSIS, ROUTINE W REFLEX MICROSCOPIC (NOT AT [facility]) - Abnormal; Notable for the following:    APPearance CLOUDY (*)    Hgb urine dipstick TRACE (*)    All other components within normal limits  URINE MICROSCOPIC-ADD ON - Abnormal; Notable for the following:    Squamous Epithelial / LPF 0-5 (*)    Bacteria, UA RARE (*)    All other components within normal limits  CBC    Imaging Review No results found. I have personally reviewed and evaluated these images and lab results as part of my medical decision-making.   EKG Interpretation None      MDM   Final diagnoses:  Elevated blood pressure  Vertigo   Victoria Allen presents for symptoms consistent with peripheral vertigo. Symptoms were acute in onset and worse with movement. Given history and exam findings, central vertigo etiology unlikely. Patient also noticed blood pressure was elevated BP triage was 174/113. Patient states she has taken all of her medication as directed. Will closely monitor. Labs were reviewed. Potassium of 2.8-this was replenished in ED. 1 L fluid bolus given. Meclizine 25 mg given.  9:36 PM - Patient  Reevaluated feels much improved. Blood pressure has improved: now 147/98  Patient encouraged to follow up with her primary care physician for blood pressure and vertigo. She states she has meclizine at home. Home care instructions, follow-up care, and return precautions were discussed. Patient states she has appointment with her PCP on Wednesday, March 15 and was encouraged to keep appointment. All questions answered.  [facility], PA-C 11/04/15 2148  Lacretia Leigh, MD 11/04/15 [phone]

## 2015-11-04 NOTE — ED Notes (Addendum)
Pt c/o HTN and dizziness, has hx of HTN. Pt has taken BP meds this morning. Pt also states she feels nauseous.

## 2015-11-13 DIAGNOSIS — Z0000 Encounter for general adult medical examination without abnormal findings: Secondary | ICD-10-CM | POA: Diagnosis not present

## 2015-11-13 DIAGNOSIS — M5020 Other cervical disc displacement, unspecified cervical region: Secondary | ICD-10-CM | POA: Diagnosis not present

## 2015-11-13 DIAGNOSIS — R42 Dizziness and giddiness: Secondary | ICD-10-CM | POA: Diagnosis not present

## 2015-11-13 DIAGNOSIS — I10 Essential (primary) hypertension: Secondary | ICD-10-CM | POA: Diagnosis not present

## 2015-11-13 MED FILL — OLMSRTN-AMLDPN-HCTZ 40-5-25: 40-5-25 | 90 days supply | Qty: 90 | Fill #0

## 2015-11-18 MED FILL — TRIBENZOR 40-5-25 MG TABLET: 40-5-25 | 90 days supply | Qty: 90 | Fill #0

## 2015-11-19 MED FILL — OSELTAMIVIR PHOS 75 MG CAP: 75 | 5 days supply | Qty: 10 | Fill #0

## 2015-12-09 DIAGNOSIS — H8113 Benign paroxysmal vertigo, bilateral: Secondary | ICD-10-CM | POA: Diagnosis not present

## 2016-02-28 MED FILL — TRIBENZOR 40-5-25 MG TABLET: 40-5-25 | 90 days supply | Qty: 90 | Fill #0

## 2016-03-20 ENCOUNTER — Encounter: Payer: Self-pay | Admitting: Gastroenterology

## 2016-03-26 ENCOUNTER — Other Ambulatory Visit: Payer: Self-pay | Admitting: Family Medicine

## 2016-03-26 DIAGNOSIS — Z1231 Encounter for screening mammogram for malignant neoplasm of breast: Secondary | ICD-10-CM

## 2016-05-01 ENCOUNTER — Encounter: Payer: Self-pay | Admitting: Family Medicine

## 2016-05-20 ENCOUNTER — Encounter: Payer: 59 | Admitting: Gastroenterology

## 2016-06-17 ENCOUNTER — Ambulatory Visit
Admission: RE | Admit: 2016-06-17 | Discharge: 2016-06-17 | Disposition: A | Discharge status: Home / Self Care | Payer: 59 | Source: Ambulatory Visit | Attending: Family Medicine | Admitting: Family Medicine

## 2016-06-17 DIAGNOSIS — Z1231 Encounter for screening mammogram for malignant neoplasm of breast: Secondary | ICD-10-CM

## 2016-06-17 IMAGING — DX DG CHEST 2V
2 series · 2 of 2 positions shown · non-contrast
Comparison: PA and lateral chest 05/22/2004.

CLINICAL DATA: Wheezing at night for 2 weeks.

EXAM:
CHEST  2 VIEW

[chest pa]
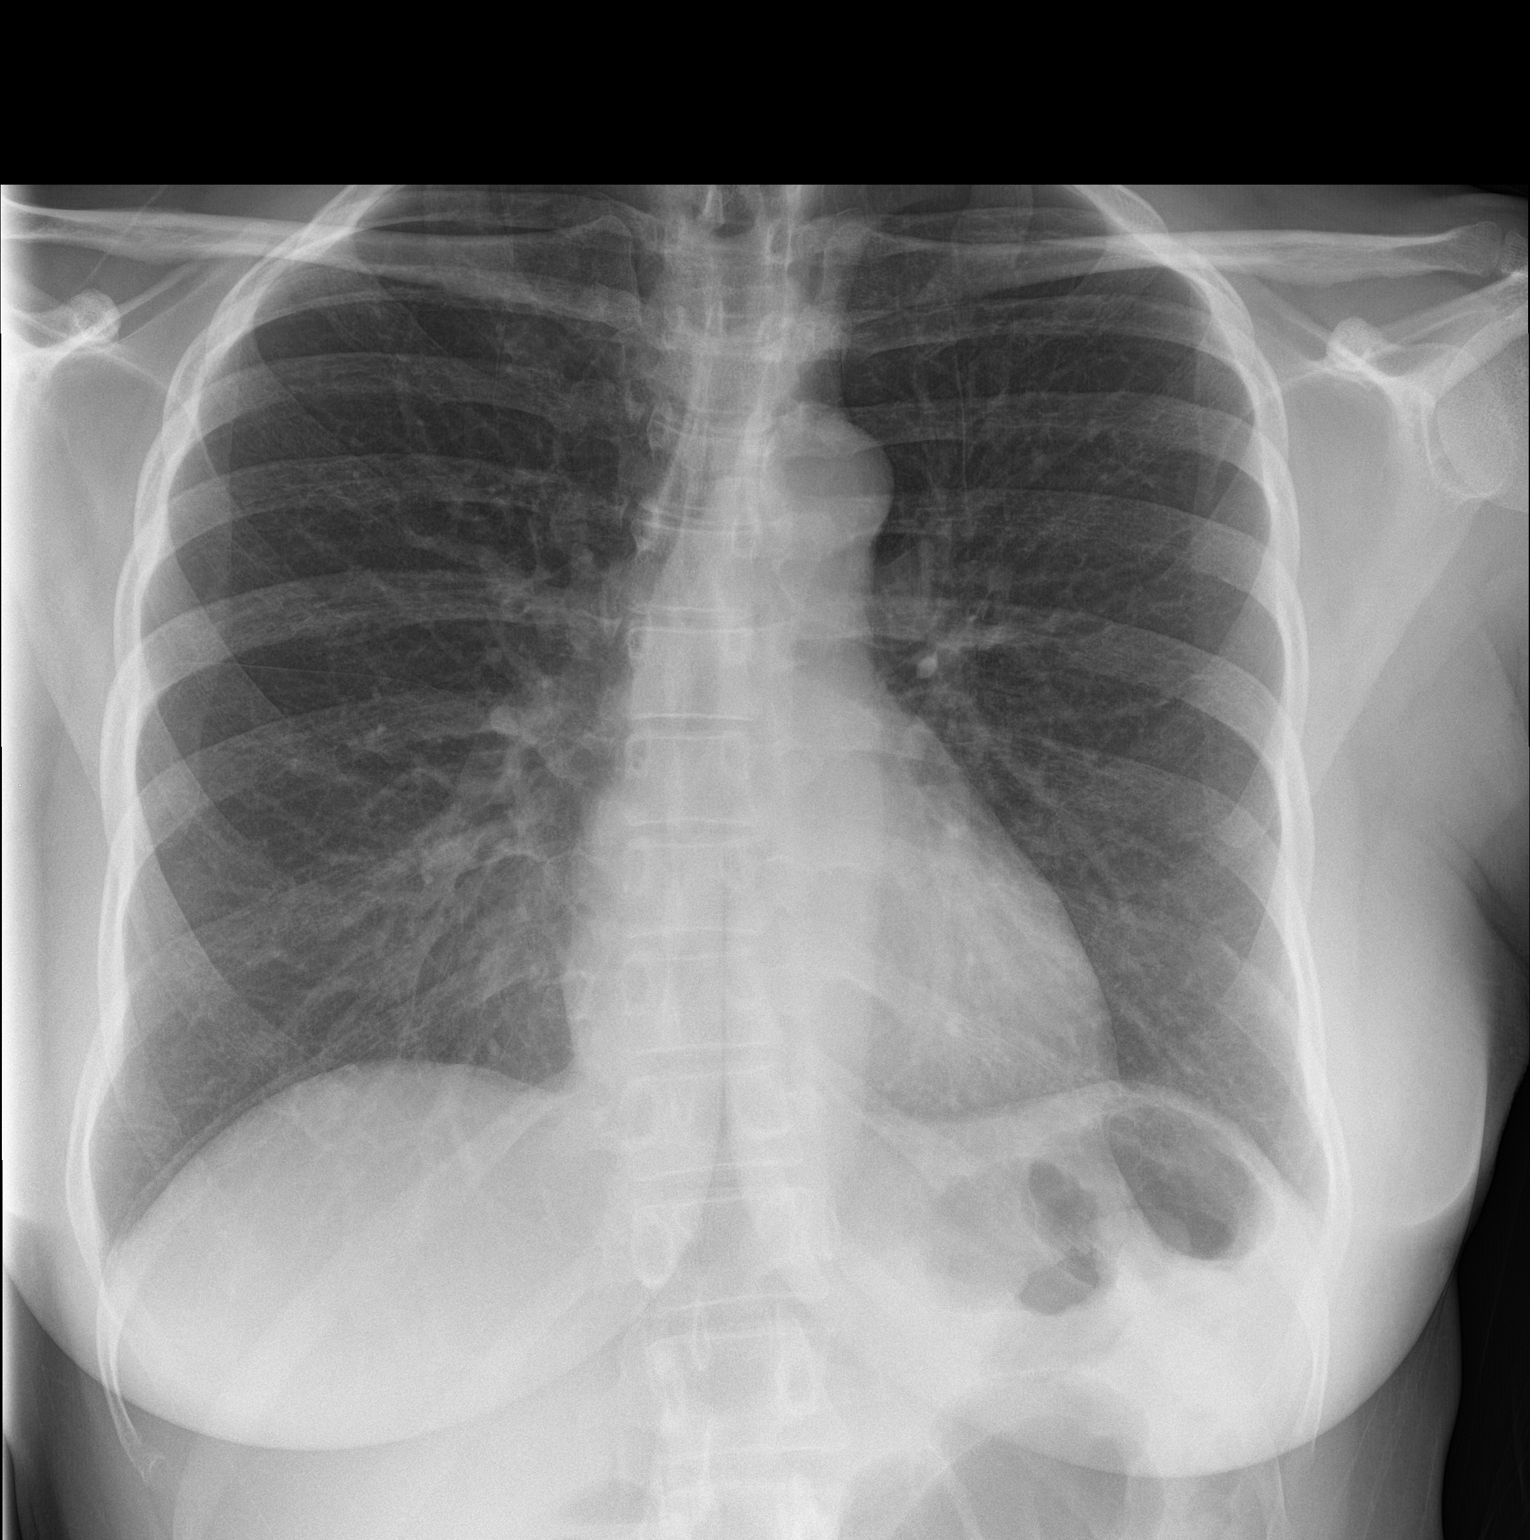

[chest lat]
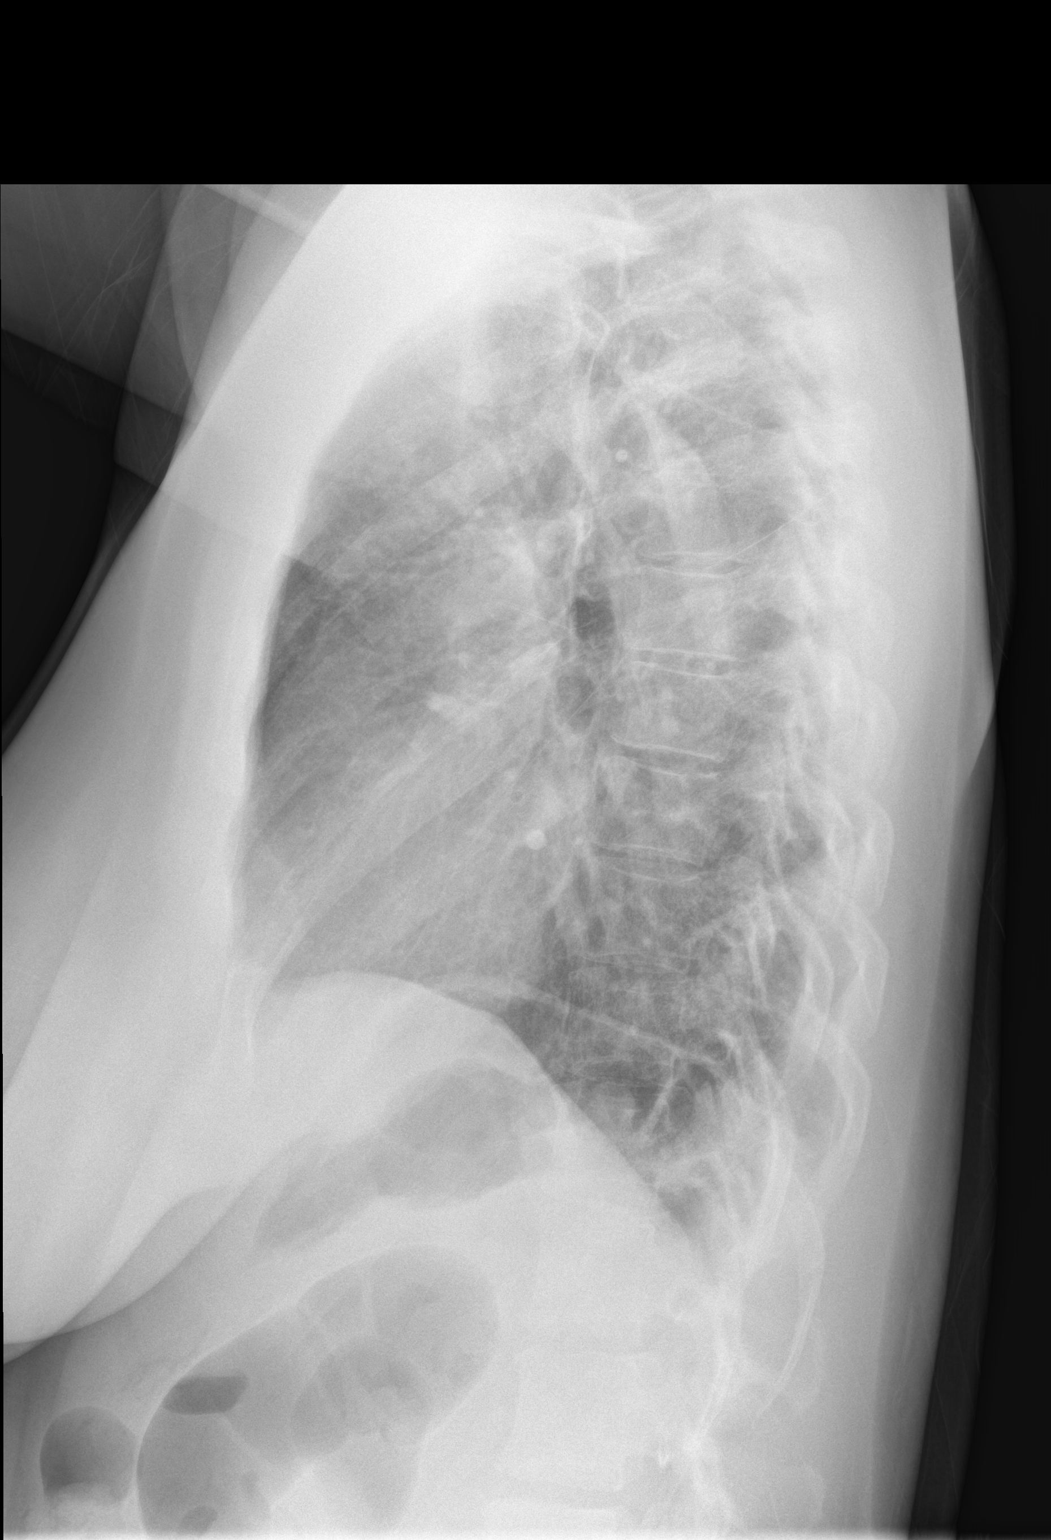

[2 of 2 positions shown; findings below may reference images not displayed]

FINDINGS: Heart size and mediastinal contours are within normal limits. Both
lungs are clear. Visualized skeletal structures are unremarkable.
IMPRESSION: Negative exam.

## 2016-11-20 IMAGING — RF DG C-ARM 61-120 MIN
1 series · 1 of 1 positions shown · non-contrast
Comparison: None.

CLINICAL DATA: Cervical disc herniation. Status post cervical spine
fusion.

EXAM:
DG C-ARM 61-120 MIN; DG CERVICAL SPINE - 1 VIEW

[Series 1: run · 1 of 1 slices shown]
[im 1/1]
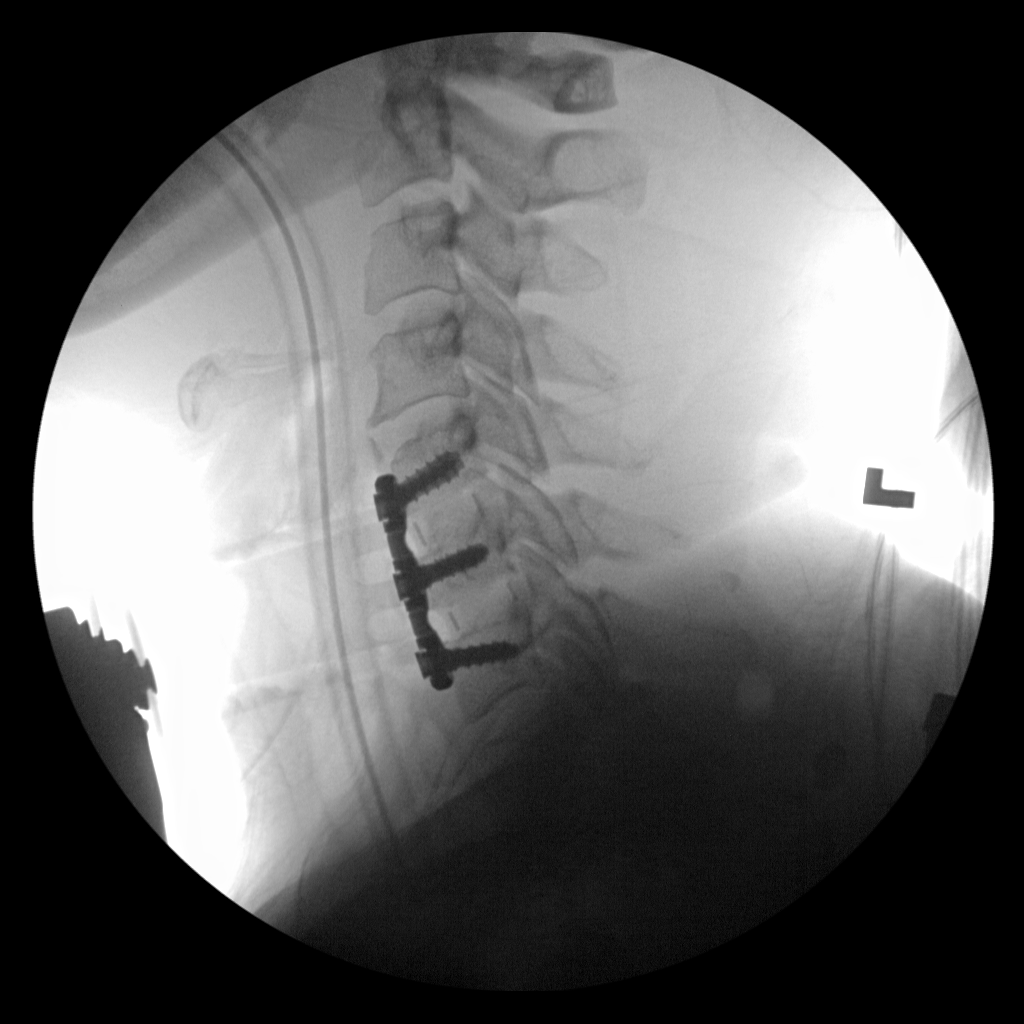

[1 of 1 positions shown; findings below may reference images not displayed]

FINDINGS: Interbody and anterior cervical spine fusion hardware seen in
expected position from levels of C5-C7. Alignment is normal these
levels. Mild degenerative vertebral osteophyte formation seen at
C4-5 without significant disc space narrowing.
IMPRESSION: Status post anterior cervical spine fusion from C5-C7 with normal
alignment.

## 2016-12-14 ENCOUNTER — Telehealth: Payer: Self-pay

## 2016-12-14 NOTE — Telephone Encounter (Signed)
SENT NOTES TO SCHEDULING 

## 2016-12-28 ENCOUNTER — Encounter (INDEPENDENT_AMBULATORY_CARE_PROVIDER_SITE_OTHER): Payer: Self-pay

## 2016-12-28 ENCOUNTER — Encounter: Payer: Self-pay | Admitting: Interventional Cardiology

## 2016-12-28 ENCOUNTER — Ambulatory Visit (INDEPENDENT_AMBULATORY_CARE_PROVIDER_SITE_OTHER): Payer: 59 | Admitting: Interventional Cardiology

## 2016-12-28 VITALS — BP 136/84 | HR 93 | Ht 62.0 in | Wt 143.5 lb

## 2016-12-28 DIAGNOSIS — R072 Precordial pain: Secondary | ICD-10-CM

## 2016-12-28 DIAGNOSIS — I10 Essential (primary) hypertension: Secondary | ICD-10-CM | POA: Diagnosis not present

## 2016-12-28 DIAGNOSIS — R9431 Abnormal electrocardiogram [ECG] [EKG]: Secondary | ICD-10-CM | POA: Diagnosis not present

## 2016-12-28 NOTE — Patient Instructions (Signed)
Medication Instructions:  Your physician recommends that you continue on your current medications as directed. Please refer to the Current Medication list given to you today.   Labwork: NONE ORDERED  Testing/Procedures: NONE ORDERED  Follow-Up: AS NEEDED PER DR. VARANASI  Any Other Special Instructions Will Be Listed Below (If Applicable).     If you need a refill on your cardiac medications before your next appointment, please call your pharmacy.

## 2016-12-28 NOTE — Progress Notes (Signed)
Cardiology Office Note   Date:  12/28/2016   ID:  Victoria Allen, DOB [birth year], MRN [id number]  PCP:  Lynne Logan, MD    Chief Complaint  Patient presents with  . Establish Care  chest pain   Wt Readings from Last 3 Encounters:  12/28/16 143 lb 8 oz (65.1 kg)  07/17/15 133 lb 4.8 oz (60.5 kg)  07/09/15 133 lb 4.8 oz (60.5 kg)       History of Present Illness: Victoria Allen is a 51 y.o. female who is being seen today for the evaluation of chest pain at the request of Sun, Vyvyan, MD.  She had a stress test several years ago that was normal    Of late, she had some stress with her father's passing.  He was on dialysis and decided to stop dialysis.  He lived for about 2 weeks and then passed away peacefully.  Since he died two weeks ago, the CP has been less frequent.  Mother had a stent placed in the last few years, she was in her 72s..  Father had CABG in his 7s. No heart issues with her sister.  She walks for exercise, 15-20 minutes at a time.  Less when allergies are bothering her.  She walks up stairs at work, 2 flights.  No sx while walking up 2 filhgts of stairs.  CP comes on a few times per month.  Longest episode was 30 minutes. Most would resolve before that.  Sx relieve after TUMS.  She feels better sitting up.        Past Medical History:  Diagnosis Date  . Allergic rhinitis   . Anemia   . Dysmenorrhea   . Fibroids    with dysmenorrhea  . Herniated disc   . Hypertension   . Varicella infection     Past Surgical History:  Procedure Laterality Date  . ABDOMINAL HYSTERECTOMY    . ANTERIOR CERVICAL DECOMP/DISCECTOMY FUSION N/A 07/17/2015   Procedure: Anterior Cervical Decompression Fusion Cervical five-six,Cervical six-seven ;  Surgeon: Eustace Moore, MD;  Location: [facility];  Service: Neurosurgery;  Laterality: N/A;  . NASAL SINUS SURGERY       Current Outpatient Prescriptions  Medication Sig Dispense Refill  . fexofenadine  (ALLEGRA) 180 MG tablet Take 180 mg by mouth daily.    . MECLIZINE HCL PO Take by mouth as needed.    . Olmesartan-Amlodipine-HCTZ (TRIBENZOR) 40-5-25 MG TABS Take 1 tablet by mouth daily.     No current facility-administered medications for this visit.     Allergies:   Codeine and Penicillins    Social History:  The patient  reports that she has never smoked. She has never used smokeless tobacco. She reports that she drinks alcohol. She reports that she does not use drugs.   Family History:  The patient's family history includes Cancer - Colon in her maternal grandmother; Cancer - Prostate in her father; Diabetes in her father; Heart attack in her father and paternal grandmother; Hypertension in her father, maternal grandmother, mother, and paternal grandmother.    ROS:  Please see the history of present illness.   Otherwise, review of systems are positive for Chest discomfort.   All other systems are reviewed and negative.    PHYSICAL EXAM: VS:  BP 136/84   Pulse 93   Ht 5\' 2"  (1.575 m)   Wt 143 lb 8 oz (65.1 kg)   SpO2 98%   BMI 26.25 kg/m  ,  BMI Body mass index is 26.25 kg/m. GEN: Well nourished, well developed, in no acute distress  HEENT: normal  Neck: no JVD, carotid bruits, or masses Cardiac: RRR; no murmurs, rubs, or gallops,no edema  Respiratory:  clear to auscultation bilaterally, normal work of breathing GI: soft, nontender, nondistended, + BS MS: no deformity or atrophy  Skin: warm and dry, no rash Neuro:  Strength and sensation are intact Psych: euthymic mood, full affect   EKG:   The ekg ordered today demonstrates NSR, diffuse ST segment changes, nonspecific- less pronounced than 12-25-2014.   Recent Labs: No results found for requested labs within last 8760 hours.   Lipid Panel No results found for: CHOL, TRIG, HDL, CHOLHDL, VLDL, LDLCALC, LDLDIRECT   Other studies Reviewed: Additional studies/ records that were reviewed today with results demonstrating:  Prior ECG showing ST segment abnormalities. They're most pronounced on the Jan 08, 2023 ECG done at her primary care physician's office. They are less pronounced on today's ECG..   ASSESSMENT AND PLAN:  1. Chest pain/abnormal ECG: The abnormal ECG has persisted for several years. The day it was most pronounced was the day after her father passed away. She was not having chest pain at the time. Overall, her chest pain has decreased in frequency over the past few weeks since her father passed away. Overall, she feels better and thinks her sx have been related to the stressful events. We discussed stress testing at this time. We decided to hold off and see how her symptoms do. If her symptoms persist, we'll plan for stress echocardiogram.  She will contact the office if she has more problems.  2. Abnormal ECG: Today's tracing is actually improved from both Dec 25, 2014 and April 2018. 3. Hypertension: Well controlled. Continue current medications.   Current medicines are reviewed at length with the patient today.  The patient concerns regarding her medicines were addressed.  The following changes have been made:  No change  Labs/ tests ordered today include:  No orders of the defined types were placed in this encounter.   Recommend 150 minutes/week of aerobic exercise Low fat, low carb, high fiber diet recommended  Disposition:   FU prn   Signed, Larae Grooms, MD  12/28/2016 3:34 PM    [facility] [place], [place], [place]  [address] Phone: [phone]; Fax: [phone]

## 2017-05-10 ENCOUNTER — Other Ambulatory Visit: Payer: Self-pay | Admitting: Family Medicine

## 2017-05-10 DIAGNOSIS — Z1231 Encounter for screening mammogram for malignant neoplasm of breast: Secondary | ICD-10-CM

## 2017-06-21 ENCOUNTER — Ambulatory Visit
Admission: RE | Admit: 2017-06-21 | Discharge: 2017-06-21 | Disposition: A | Discharge status: Home / Self Care | Payer: 59 | Source: Ambulatory Visit | Attending: Family Medicine | Admitting: Family Medicine

## 2017-06-21 DIAGNOSIS — Z1231 Encounter for screening mammogram for malignant neoplasm of breast: Secondary | ICD-10-CM

## 2017-07-26 ENCOUNTER — Other Ambulatory Visit: Payer: Self-pay | Admitting: Family Medicine

## 2017-07-26 ENCOUNTER — Ambulatory Visit
Admission: RE | Admit: 2017-07-26 | Discharge: 2017-07-26 | Disposition: A | Discharge status: Home / Self Care | Payer: 59 | Source: Ambulatory Visit | Attending: Family Medicine | Admitting: Family Medicine

## 2017-07-26 DIAGNOSIS — R52 Pain, unspecified: Secondary | ICD-10-CM

## 2018-05-12 ENCOUNTER — Other Ambulatory Visit: Payer: Self-pay | Admitting: Family Medicine

## 2018-05-12 DIAGNOSIS — Z1231 Encounter for screening mammogram for malignant neoplasm of breast: Secondary | ICD-10-CM

## 2018-06-23 ENCOUNTER — Ambulatory Visit
Admission: RE | Admit: 2018-06-23 | Discharge: 2018-06-23 | Disposition: A | Discharge status: Home / Self Care | Payer: 59 | Source: Ambulatory Visit | Attending: Family Medicine | Admitting: Family Medicine

## 2018-06-23 DIAGNOSIS — Z1231 Encounter for screening mammogram for malignant neoplasm of breast: Secondary | ICD-10-CM

## 2019-04-28 ENCOUNTER — Other Ambulatory Visit: Payer: Self-pay | Admitting: Family Medicine

## 2019-04-28 DIAGNOSIS — Z1231 Encounter for screening mammogram for malignant neoplasm of breast: Secondary | ICD-10-CM

## 2019-06-26 ENCOUNTER — Other Ambulatory Visit: Payer: Self-pay

## 2019-06-26 ENCOUNTER — Ambulatory Visit
Admission: RE | Admit: 2019-06-26 | Discharge: 2019-06-26 | Disposition: A | Discharge status: Home / Self Care | Payer: 59 | Source: Ambulatory Visit | Attending: Family Medicine | Admitting: Family Medicine

## 2019-06-26 DIAGNOSIS — Z1231 Encounter for screening mammogram for malignant neoplasm of breast: Secondary | ICD-10-CM

## 2019-08-24 ENCOUNTER — Ambulatory Visit (HOSPITAL_BASED_OUTPATIENT_CLINIC_OR_DEPARTMENT_OTHER)
Admission: RE | Admit: 2019-08-24 | Discharge: 2019-08-24 | Disposition: A | Discharge status: Home / Self Care | Payer: 59 | Source: Ambulatory Visit | Attending: Family Medicine | Admitting: Family Medicine

## 2019-08-24 ENCOUNTER — Other Ambulatory Visit: Payer: Self-pay

## 2019-08-24 ENCOUNTER — Other Ambulatory Visit (HOSPITAL_BASED_OUTPATIENT_CLINIC_OR_DEPARTMENT_OTHER): Payer: Self-pay | Admitting: Family Medicine

## 2019-08-24 DIAGNOSIS — R059 Cough, unspecified: Secondary | ICD-10-CM

## 2019-08-24 DIAGNOSIS — R05 Cough: Secondary | ICD-10-CM | POA: Diagnosis not present

## 2019-09-29 ENCOUNTER — Ambulatory Visit: Payer: 59 | Attending: Internal Medicine

## 2019-09-29 DIAGNOSIS — Z20822 Contact with and (suspected) exposure to covid-19: Secondary | ICD-10-CM

## 2019-09-30 LAB — NOVEL CORONAVIRUS, NAA: SARS-CoV-2, NAA: NOT DETECTED

## 2020-04-01 ENCOUNTER — Other Ambulatory Visit (HOSPITAL_COMMUNITY): Payer: Self-pay | Admitting: Family Medicine

## 2020-04-03 MED FILL — OLMESARTAN-HCTZ 40-12.5 MG: 40-12.5 | 90 days supply | Qty: 90 | Fill #0

## 2020-04-08 ENCOUNTER — Other Ambulatory Visit: Payer: Self-pay | Admitting: Family Medicine

## 2020-04-08 DIAGNOSIS — Z1231 Encounter for screening mammogram for malignant neoplasm of breast: Secondary | ICD-10-CM

## 2020-04-11 ENCOUNTER — Other Ambulatory Visit (HOSPITAL_COMMUNITY): Payer: Self-pay | Admitting: Family Medicine

## 2020-04-23 MED FILL — POTASSIUM CHLORIDE CRYS ER: 20 | 30 days supply | Qty: 30 | Fill #0

## 2020-05-14 ENCOUNTER — Other Ambulatory Visit (HOSPITAL_COMMUNITY): Payer: Self-pay | Admitting: Internal Medicine

## 2020-05-14 MED FILL — SHINGRIX 50 MCG SUS: 50 | 1 days supply | Qty: 1 | Fill #0

## 2020-06-03 ENCOUNTER — Other Ambulatory Visit (HOSPITAL_COMMUNITY): Payer: Self-pay | Admitting: Family Medicine

## 2020-06-03 DIAGNOSIS — M5442 Lumbago with sciatica, left side: Secondary | ICD-10-CM | POA: Diagnosis not present

## 2020-06-03 MED FILL — DICLOFENAC SOD EC 75 MG TAB: 75 | 10 days supply | Qty: 20 | Fill #0

## 2020-06-03 MED FILL — CYCLOBENZAPRINE HCL 5 MG TA: 5 | 7 days supply | Qty: 21 | Fill #0

## 2020-06-06 ENCOUNTER — Other Ambulatory Visit (HOSPITAL_COMMUNITY): Payer: Self-pay

## 2020-06-06 MED FILL — OMRON 3 SERIES BP MONITOR D: 1 days supply | Qty: 1 | Fill #0

## 2020-06-06 MED FILL — POTASSIUM CHLORIDE CRYS ER: 20 | 30 days supply | Qty: 30 | Fill #1

## 2020-06-26 ENCOUNTER — Other Ambulatory Visit: Payer: Self-pay | Admitting: Family Medicine

## 2020-06-26 ENCOUNTER — Ambulatory Visit
Admission: RE | Admit: 2020-06-26 | Discharge: 2020-06-26 | Disposition: A | Discharge status: Home / Self Care | Payer: 59 | Source: Ambulatory Visit | Attending: Family Medicine | Admitting: Family Medicine

## 2020-06-26 ENCOUNTER — Other Ambulatory Visit (HOSPITAL_COMMUNITY): Payer: Self-pay | Admitting: Family Medicine

## 2020-06-26 ENCOUNTER — Other Ambulatory Visit: Payer: Self-pay

## 2020-06-26 DIAGNOSIS — N6452 Nipple discharge: Secondary | ICD-10-CM

## 2020-06-26 DIAGNOSIS — Z1231 Encounter for screening mammogram for malignant neoplasm of breast: Secondary | ICD-10-CM

## 2020-06-26 DIAGNOSIS — I10 Essential (primary) hypertension: Secondary | ICD-10-CM | POA: Diagnosis not present

## 2020-06-26 DIAGNOSIS — N644 Mastodynia: Secondary | ICD-10-CM | POA: Diagnosis not present

## 2020-06-26 MED FILL — CEPHALEXIN 500 MG CAPSULE: 500 | 7 days supply | Qty: 28 | Fill #0

## 2020-06-26 MED FILL — NYSTATIN 100,000 UNIT/GM CR: 100000 | 15 days supply | Qty: 30 | Fill #0

## 2020-06-28 ENCOUNTER — Other Ambulatory Visit: Payer: Self-pay | Admitting: Family Medicine

## 2020-06-28 DIAGNOSIS — N6452 Nipple discharge: Secondary | ICD-10-CM

## 2020-06-28 DIAGNOSIS — N644 Mastodynia: Secondary | ICD-10-CM

## 2020-07-02 ENCOUNTER — Ambulatory Visit
Admission: RE | Admit: 2020-07-02 | Discharge: 2020-07-02 | Disposition: A | Discharge status: Home / Self Care | Payer: 59 | Source: Ambulatory Visit | Attending: Family Medicine | Admitting: Family Medicine

## 2020-07-02 ENCOUNTER — Other Ambulatory Visit: Payer: Self-pay

## 2020-07-02 DIAGNOSIS — N644 Mastodynia: Secondary | ICD-10-CM

## 2020-07-02 DIAGNOSIS — N6452 Nipple discharge: Secondary | ICD-10-CM

## 2020-07-02 DIAGNOSIS — R922 Inconclusive mammogram: Secondary | ICD-10-CM | POA: Diagnosis not present

## 2020-07-02 DIAGNOSIS — N6041 Mammary duct ectasia of right breast: Secondary | ICD-10-CM | POA: Diagnosis not present

## 2020-07-05 MED FILL — OLMESARTAN-HCTZ 40-12.5 MG: 40-12.5 | 90 days supply | Qty: 90 | Fill #1

## 2020-07-08 ENCOUNTER — Other Ambulatory Visit: Payer: Self-pay | Admitting: Family Medicine

## 2020-07-08 DIAGNOSIS — N6452 Nipple discharge: Secondary | ICD-10-CM

## 2020-07-08 DIAGNOSIS — N644 Mastodynia: Secondary | ICD-10-CM

## 2020-07-10 ENCOUNTER — Ambulatory Visit
Admission: RE | Admit: 2020-07-10 | Discharge: 2020-07-10 | Disposition: A | Discharge status: Home / Self Care | Payer: 59 | Source: Ambulatory Visit | Attending: Family Medicine | Admitting: Family Medicine

## 2020-07-10 ENCOUNTER — Other Ambulatory Visit: Payer: Self-pay

## 2020-07-10 ENCOUNTER — Other Ambulatory Visit: Payer: Self-pay | Admitting: Internal Medicine

## 2020-07-10 DIAGNOSIS — N644 Mastodynia: Secondary | ICD-10-CM | POA: Diagnosis not present

## 2020-07-10 DIAGNOSIS — N6452 Nipple discharge: Secondary | ICD-10-CM

## 2020-07-10 DIAGNOSIS — R599 Enlarged lymph nodes, unspecified: Secondary | ICD-10-CM

## 2020-07-10 MED ORDER — GADOBUTROL 1 MMOL/ML IV SOLN
7.0000 mL | Freq: Once | INTRAVENOUS | Status: AC | PRN
  Administered 2020-07-10: 7 mL via INTRAVENOUS

## 2020-07-11 ENCOUNTER — Other Ambulatory Visit: Payer: 59

## 2020-07-11 ENCOUNTER — Other Ambulatory Visit: Payer: Self-pay | Admitting: Family Medicine

## 2020-07-11 DIAGNOSIS — R599 Enlarged lymph nodes, unspecified: Secondary | ICD-10-CM

## 2020-07-15 ENCOUNTER — Other Ambulatory Visit: Payer: 59

## 2020-07-16 ENCOUNTER — Other Ambulatory Visit: Payer: Self-pay

## 2020-07-16 ENCOUNTER — Encounter (HOSPITAL_BASED_OUTPATIENT_CLINIC_OR_DEPARTMENT_OTHER): Payer: Self-pay | Admitting: General Surgery

## 2020-07-16 ENCOUNTER — Ambulatory Visit: Payer: Self-pay | Admitting: General Surgery

## 2020-07-16 DIAGNOSIS — N6452 Nipple discharge: Secondary | ICD-10-CM | POA: Diagnosis not present

## 2020-07-17 ENCOUNTER — Other Ambulatory Visit (HOSPITAL_COMMUNITY)
Admission: RE | Admit: 2020-07-17 | Discharge: 2020-07-17 | Disposition: A | Discharge status: Home / Self Care | Payer: 59 | Source: Ambulatory Visit | Attending: General Surgery | Admitting: General Surgery

## 2020-07-17 ENCOUNTER — Other Ambulatory Visit: Payer: 59

## 2020-07-17 ENCOUNTER — Encounter (HOSPITAL_BASED_OUTPATIENT_CLINIC_OR_DEPARTMENT_OTHER)
Admission: RE | Admit: 2020-07-17 | Discharge: 2020-07-17 | Disposition: A | Discharge status: Home / Self Care | Payer: 59 | Source: Ambulatory Visit | Attending: General Surgery | Admitting: General Surgery

## 2020-07-17 ENCOUNTER — Ambulatory Visit
Admission: RE | Admit: 2020-07-17 | Discharge: 2020-07-17 | Disposition: A | Discharge status: Home / Self Care | Payer: 59 | Source: Ambulatory Visit | Attending: Family Medicine | Admitting: Family Medicine

## 2020-07-17 ENCOUNTER — Other Ambulatory Visit: Payer: Self-pay | Admitting: Family Medicine

## 2020-07-17 DIAGNOSIS — Z01818 Encounter for other preprocedural examination: Secondary | ICD-10-CM | POA: Insufficient documentation

## 2020-07-17 DIAGNOSIS — R599 Enlarged lymph nodes, unspecified: Secondary | ICD-10-CM

## 2020-07-17 DIAGNOSIS — Z20822 Contact with and (suspected) exposure to covid-19: Secondary | ICD-10-CM | POA: Insufficient documentation

## 2020-07-17 DIAGNOSIS — N6489 Other specified disorders of breast: Secondary | ICD-10-CM | POA: Diagnosis not present

## 2020-07-17 DIAGNOSIS — Z01812 Encounter for preprocedural laboratory examination: Secondary | ICD-10-CM | POA: Diagnosis present

## 2020-07-17 LAB — BASIC METABOLIC PANEL
Anion gap: 8 (ref 5–15)
BUN: 8 mg/dL (ref 6–20)
CO2: 25 mmol/L (ref 22–32)
Calcium: 9.4 mg/dL (ref 8.9–10.3)
Chloride: 105 mmol/L (ref 98–111)
Creatinine, Ser: 0.83 mg/dL (ref 0.44–1.00)
GFR, Estimated: >60 mL/min (ref >60)
Glucose, Bld: 135 mg/dL — ABNORMAL HIGH (ref 70–99)
Potassium: 3.3 mmol/L — ABNORMAL LOW (ref 3.5–5.1)
Sodium: 138 mmol/L (ref 135–145)

## 2020-07-17 LAB — SARS CORONAVIRUS 2 (TAT 6-24 HRS): SARS Coronavirus 2: NEGATIVE

## 2020-07-17 MED ORDER — CHLORHEXIDINE GLUCONATE CLOTH 2 % EX PADS: 6.0000 | MEDICATED_PAD | Freq: Once | CUTANEOUS | Status: DC

## 2020-07-17 NOTE — Progress Notes (Signed)
      Enhanced Recovery after Surgery for Orthopedics Enhanced Recovery after Surgery is a protocol used to improve the stress on your body and your recovery after surgery.  Patient Instructions  . The night before surgery:  o No food after midnight. ONLY clear liquids after midnight  . The day of surgery (if you do NOT have diabetes):  o Drink ONE (1) Pre-Surgery Clear Ensure as directed.   o This drink was given to you during your hospital  pre-op appointment visit. o The pre-op nurse will instruct you on the time to drink the  Pre-Surgery Ensure depending on your surgery time. o Finish the drink at the designated time by the pre-op nurse.  o Nothing else to drink after completing the  Pre-Surgery Clear Ensure.  . The day of surgery (if you have diabetes): o Drink ONE (1) Gatorade 2 (G2) as directed. o This drink was given to you during your hospital  pre-op appointment visit.  o The pre-op nurse will instruct you on the time to drink the   Gatorade 2 (G2) depending on your surgery time. o Color of the Gatorade may vary. Red is not allowed. o Nothing else to drink after completing the  Gatorade 2 (G2).         If you have questions, please contact your surgeon's office.  Surgical soap given with instructions to pt, pt verbalized understandings.

## 2020-07-19 ENCOUNTER — Ambulatory Visit (HOSPITAL_BASED_OUTPATIENT_CLINIC_OR_DEPARTMENT_OTHER): Payer: 59 | Admitting: Certified Registered"

## 2020-07-19 ENCOUNTER — Encounter (HOSPITAL_BASED_OUTPATIENT_CLINIC_OR_DEPARTMENT_OTHER): Payer: Self-pay | Admitting: General Surgery

## 2020-07-19 ENCOUNTER — Other Ambulatory Visit: Payer: Self-pay

## 2020-07-19 ENCOUNTER — Ambulatory Visit (HOSPITAL_BASED_OUTPATIENT_CLINIC_OR_DEPARTMENT_OTHER)
Admission: RE | Admit: 2020-07-19 | Discharge: 2020-07-19 | Disposition: A | Discharge status: Home / Self Care | Payer: 59 | Attending: General Surgery | Admitting: General Surgery

## 2020-07-19 ENCOUNTER — Encounter (HOSPITAL_BASED_OUTPATIENT_CLINIC_OR_DEPARTMENT_OTHER)
Admission: RE | Disposition: A | Discharge status: Home / Self Care | Payer: Self-pay | Source: Home / Self Care | Attending: General Surgery

## 2020-07-19 ENCOUNTER — Other Ambulatory Visit (HOSPITAL_BASED_OUTPATIENT_CLINIC_OR_DEPARTMENT_OTHER): Payer: Self-pay | Admitting: General Surgery

## 2020-07-19 DIAGNOSIS — L309 Dermatitis, unspecified: Secondary | ICD-10-CM | POA: Diagnosis not present

## 2020-07-19 DIAGNOSIS — D649 Anemia, unspecified: Secondary | ICD-10-CM | POA: Diagnosis not present

## 2020-07-19 DIAGNOSIS — N644 Mastodynia: Secondary | ICD-10-CM | POA: Insufficient documentation

## 2020-07-19 DIAGNOSIS — R9431 Abnormal electrocardiogram [ECG] [EKG]: Secondary | ICD-10-CM | POA: Diagnosis not present

## 2020-07-19 DIAGNOSIS — Z803 Family history of malignant neoplasm of breast: Secondary | ICD-10-CM | POA: Diagnosis not present

## 2020-07-19 DIAGNOSIS — N6452 Nipple discharge: Secondary | ICD-10-CM | POA: Diagnosis not present

## 2020-07-19 DIAGNOSIS — Z885 Allergy status to narcotic agent status: Secondary | ICD-10-CM | POA: Diagnosis not present

## 2020-07-19 DIAGNOSIS — L308 Other specified dermatitis: Secondary | ICD-10-CM | POA: Diagnosis not present

## 2020-07-19 DIAGNOSIS — Z880 Allergy status to penicillin: Secondary | ICD-10-CM | POA: Insufficient documentation

## 2020-07-19 DIAGNOSIS — I10 Essential (primary) hypertension: Secondary | ICD-10-CM | POA: Diagnosis not present

## 2020-07-19 HISTORY — PX: BREAST BIOPSY: SHX20

## 2020-07-19 SURGERY — BREAST BIOPSY
Anesthesia: General | Site: Breast | Laterality: Right

## 2020-07-19 MED ORDER — CELECOXIB 200 MG PO CAPS
200.0000 mg | ORAL_CAPSULE | ORAL | Status: AC
  Administered 2020-07-19: 200 mg via ORAL

## 2020-07-19 MED ORDER — CELECOXIB 200 MG PO CAPS
ORAL_CAPSULE | ORAL | Status: AC
  Filled 2020-07-19: qty 1

## 2020-07-19 MED ORDER — ONDANSETRON HCL 4 MG/2ML IJ SOLN
INTRAMUSCULAR | Status: AC
  Filled 2020-07-19: qty 12

## 2020-07-19 MED ORDER — OXYCODONE HCL 5 MG/5ML PO SOLN: 5.0000 mg | Freq: Once | ORAL | Status: DC | PRN

## 2020-07-19 MED ORDER — PROPOFOL 10 MG/ML IV BOLUS
INTRAVENOUS | Status: DC | PRN
  Administered 2020-07-19: 200 mg via INTRAVENOUS

## 2020-07-19 MED ORDER — ONDANSETRON HCL 4 MG/2ML IJ SOLN: 4.0000 mg | Freq: Four times a day (QID) | INTRAMUSCULAR | Status: DC | PRN

## 2020-07-19 MED ORDER — FENTANYL CITRATE (PF) 100 MCG/2ML IJ SOLN
INTRAMUSCULAR | Status: AC
  Filled 2020-07-19: qty 2

## 2020-07-19 MED ORDER — FENTANYL CITRATE (PF) 100 MCG/2ML IJ SOLN: 25.0000 ug | INTRAMUSCULAR | Status: DC | PRN

## 2020-07-19 MED ORDER — VANCOMYCIN HCL IN DEXTROSE 1-5 GM/200ML-% IV SOLN
INTRAVENOUS | Status: AC
  Filled 2020-07-19: qty 200

## 2020-07-19 MED ORDER — BUPIVACAINE-EPINEPHRINE 0.25% -1:200000 IJ SOLN
INTRAMUSCULAR | Status: DC | PRN
  Administered 2020-07-19: 10 mL

## 2020-07-19 MED ORDER — PHENYLEPHRINE 40 MCG/ML (10ML) SYRINGE FOR IV PUSH (FOR BLOOD PRESSURE SUPPORT)
PREFILLED_SYRINGE | INTRAVENOUS | Status: AC
  Filled 2020-07-19: qty 20

## 2020-07-19 MED ORDER — DEXAMETHASONE SODIUM PHOSPHATE 10 MG/ML IJ SOLN
INTRAMUSCULAR | Status: AC
  Filled 2020-07-19: qty 2

## 2020-07-19 MED ORDER — GABAPENTIN 300 MG PO CAPS
300.0000 mg | ORAL_CAPSULE | ORAL | Status: AC
  Administered 2020-07-19: 300 mg via ORAL

## 2020-07-19 MED ORDER — ACETAMINOPHEN 500 MG PO TABS
ORAL_TABLET | ORAL | Status: AC
  Filled 2020-07-19: qty 2

## 2020-07-19 MED ORDER — VANCOMYCIN HCL IN DEXTROSE 1-5 GM/200ML-% IV SOLN
1000.0000 mg | INTRAVENOUS | Status: AC
  Administered 2020-07-19: 1000 mg via INTRAVENOUS

## 2020-07-19 MED ORDER — ACETAMINOPHEN 500 MG PO TABS
1000.0000 mg | ORAL_TABLET | ORAL | Status: AC
  Administered 2020-07-19: 1000 mg via ORAL

## 2020-07-19 MED ORDER — ONDANSETRON HCL 4 MG/2ML IJ SOLN
INTRAMUSCULAR | Status: DC | PRN
  Administered 2020-07-19: 4 mg via INTRAVENOUS

## 2020-07-19 MED ORDER — EPHEDRINE 5 MG/ML INJ
INTRAVENOUS | Status: AC
  Filled 2020-07-19: qty 30

## 2020-07-19 MED ORDER — TRAMADOL HCL 50 MG PO TABS: 50.0000 mg | ORAL_TABLET | Freq: Four times a day (QID) | ORAL | 1 refills | Status: DC | PRN

## 2020-07-19 MED ORDER — DEXAMETHASONE SODIUM PHOSPHATE 4 MG/ML IJ SOLN
INTRAMUSCULAR | Status: DC | PRN
  Administered 2020-07-19: 4 mg via INTRAVENOUS

## 2020-07-19 MED ORDER — LACTATED RINGERS IV SOLN: INTRAVENOUS | Status: DC

## 2020-07-19 MED ORDER — GABAPENTIN 300 MG PO CAPS
ORAL_CAPSULE | ORAL | Status: AC
  Filled 2020-07-19: qty 1

## 2020-07-19 MED ORDER — MIDAZOLAM HCL 2 MG/2ML IJ SOLN
INTRAMUSCULAR | Status: AC
  Filled 2020-07-19: qty 2

## 2020-07-19 MED ORDER — OXYCODONE HCL 5 MG PO TABS: 5.0000 mg | ORAL_TABLET | Freq: Once | ORAL | Status: DC | PRN

## 2020-07-19 MED ORDER — EPHEDRINE 5 MG/ML INJ
INTRAVENOUS | Status: AC
  Filled 2020-07-19: qty 10

## 2020-07-19 MED ORDER — LIDOCAINE 2% (20 MG/ML) 5 ML SYRINGE
INTRAMUSCULAR | Status: AC
  Filled 2020-07-19: qty 20

## 2020-07-19 MED ORDER — LIDOCAINE 2% (20 MG/ML) 5 ML SYRINGE
INTRAMUSCULAR | Status: DC | PRN
  Administered 2020-07-19: 60 mg via INTRAVENOUS

## 2020-07-19 MED ORDER — FENTANYL CITRATE (PF) 100 MCG/2ML IJ SOLN
INTRAMUSCULAR | Status: DC | PRN
  Administered 2020-07-19 (×2): 50 ug via INTRAVENOUS

## 2020-07-19 MED FILL — traMADol HCL 50 MG TABS: 50 | 3 days supply | Qty: 20 | Fill #0

## 2020-07-19 SURGICAL SUPPLY — 38 items
ADH SKN CLS APL DERMABOND .7 (GAUZE/BANDAGES/DRESSINGS)
APL PRP STRL LF DISP 70% ISPRP (MISCELLANEOUS) ×1
BLADE SURG 15 STRL LF DISP TIS (BLADE) ×1 IMPLANT
BLADE SURG 15 STRL SS (BLADE) ×3
CANISTER SUCT 1200ML W/VALVE (MISCELLANEOUS) IMPLANT
CHLORAPREP W/TINT 26 (MISCELLANEOUS) ×3 IMPLANT
CLIP VESOCCLUDE SM WIDE 6/CT (CLIP) IMPLANT
COVER BACK TABLE 60X90IN (DRAPES) ×3 IMPLANT
COVER MAYO STAND STRL (DRAPES) ×3 IMPLANT
COVER WAND RF STERILE (DRAPES) IMPLANT
DECANTER SPIKE VIAL GLASS SM (MISCELLANEOUS) IMPLANT
DERMABOND ADVANCED (GAUZE/BANDAGES/DRESSINGS)
DERMABOND ADVANCED .7 DNX12 (GAUZE/BANDAGES/DRESSINGS) IMPLANT
DRAPE LAPAROSCOPIC ABDOMINAL (DRAPES) ×3 IMPLANT
DRAPE UTILITY XL STRL (DRAPES) ×3 IMPLANT
ELECT COATED BLADE 2.86 ST (ELECTRODE) IMPLANT
ELECT REM PT RETURN 9FT ADLT (ELECTROSURGICAL) ×3
ELECTRODE REM PT RTRN 9FT ADLT (ELECTROSURGICAL) ×1 IMPLANT
GLOVE BIO SURGEON STRL SZ7.5 (GLOVE) ×6 IMPLANT
GOWN STRL REUS W/ TWL LRG LVL3 (GOWN DISPOSABLE) ×2 IMPLANT
GOWN STRL REUS W/TWL LRG LVL3 (GOWN DISPOSABLE) ×6
ILLUMINATOR WAVEGUIDE N/F (MISCELLANEOUS) IMPLANT
LIGHT WAVEGUIDE WIDE FLAT (MISCELLANEOUS) IMPLANT
NEEDLE HYPO 25X1 1.5 SAFETY (NEEDLE) ×3 IMPLANT
NS IRRIG 1000ML POUR BTL (IV SOLUTION) ×3 IMPLANT
PACK BASIN DAY SURGERY FS (CUSTOM PROCEDURE TRAY) ×3 IMPLANT
PENCIL SMOKE EVACUATOR (MISCELLANEOUS) ×3 IMPLANT
SLEEVE SCD COMPRESS KNEE MED (MISCELLANEOUS) ×3 IMPLANT
SPONGE LAP 18X18 RF (DISPOSABLE) ×3 IMPLANT
SUT MON AB 4-0 PC3 18 (SUTURE) ×3 IMPLANT
SUT SILK 2 0 SH (SUTURE) IMPLANT
SUT VICRYL 3-0 CR8 SH (SUTURE) IMPLANT
SYR CONTROL 10ML LL (SYRINGE) ×3 IMPLANT
TOWEL GREEN STERILE FF (TOWEL DISPOSABLE) ×3 IMPLANT
TRAY FAXITRON CT DISP (TRAY / TRAY PROCEDURE) IMPLANT
TUBE CONNECTING 20'X1/4 (TUBING) ×1
TUBE CONNECTING 20X1/4 (TUBING) ×2 IMPLANT
YANKAUER SUCT BULB TIP NO VENT (SUCTIONS) ×3 IMPLANT

## 2020-07-19 NOTE — Anesthesia Postprocedure Evaluation (Signed)
Anesthesia Post Note  Patient: Psychologist, clinical  Procedure(s) Performed: RIGHT BREAST NIPPLE BIOPSY (Right Breast)     Patient location during evaluation: PACU Anesthesia Type: General Level of consciousness: awake and alert Pain management: pain level controlled Vital Signs Assessment: post-procedure vital signs reviewed and stable Respiratory status: spontaneous breathing, nonlabored ventilation, respiratory function stable and patient connected to nasal cannula oxygen Cardiovascular status: blood pressure returned to baseline and stable Postop Assessment: no apparent nausea or vomiting Anesthetic complications: no   No complications documented.  Last Vitals:  Vitals:   07/19/20 1245 07/19/20 1257  BP: (!) 141/79 132/81  Pulse:  98  Resp:  17  Temp:  36.4 C  SpO2:  100%    Last Pain:  Vitals:   07/19/20 1300  TempSrc:   PainSc: 0-No pain                 Tanna Loeffler S

## 2020-07-19 NOTE — Anesthesia Preprocedure Evaluation (Signed)
Anesthesia Evaluation  Patient identified by MRN, date of birth, ID band Patient awake    Reviewed: Allergy & Precautions, H&P , NPO status , Patient's Chart, lab work & pertinent test results  Airway Mallampati: II   Neck ROM: full    Dental   Pulmonary neg pulmonary ROS,    breath sounds clear to auscultation       Cardiovascular hypertension,  Rhythm:regular Rate:Normal     Neuro/Psych    GI/Hepatic   Endo/Other    Renal/GU      Musculoskeletal   Abdominal   Peds  Hematology   Anesthesia Other Findings   Reproductive/Obstetrics hysterectomy                             Anesthesia Physical Anesthesia Plan  ASA: II  Anesthesia Plan: General   Post-op Pain Management:    Induction: Intravenous  PONV Risk Score and Plan: 3 and Ondansetron, Dexamethasone, Midazolam and Treatment may vary due to age or medical condition  Airway Management Planned: LMA  Additional Equipment:   Intra-op Plan:   Post-operative Plan: Extubation in OR  Informed Consent: I have reviewed the patients History and Physical, chart, labs and discussed the procedure including the risks, benefits and alternatives for the proposed anesthesia with the patient or authorized representative who has indicated his/her understanding and acceptance.       Plan Discussed with: CRNA, Anesthesiologist and Surgeon  Anesthesia Plan Comments:         Anesthesia Quick Evaluation

## 2020-07-19 NOTE — Discharge Instructions (Signed)

## 2020-07-19 NOTE — H&P (Signed)
Victoria Allen  Location: [facility] Patient #: [id number] DOB: [birth year] Married / Language: English / Race: Black or African American Female   History of Present Illness  The patient is a 54 year old female who presents with a complaint of Breast pain. We are asked to see the patient in consultation by Dr. Donald Prose to evaluate her for right nipple discharge. The patient is a 54 year old black female who is been experiencing sharp pains in the upper right breast for the last month or more. During that same time she is developed bloody nipple discharge from the right nipple. This is occurring on a daily basis. She has significant tenderness and sensitivity associated with the right nipple. She was evaluated with MRI study that showed no underlying abnormalities but significant enhancement of the right nipple and areola. She does have a family history of breast cancer in a sister. She does not smoke.   Past Surgical History Hysterectomy (not due to cancer) - Complete  Spinal Surgery - Neck   Diagnostic Studies History Colonoscopy  5-10 years ago Mammogram  within last year  Allergies Penicillin V Potassium *PENICILLINS*  Codeine Sulfate *ANALGESICS - OPIOID*  Allergies Reconciled   Medication History  Allegra (60MG  Tablet, Oral) Active. Potassium Chloride (20MEQ Tablet ER, Oral) Active. Benicar HCT (40-12.5MG  Tablet, Oral) Active. Meclizine HCl (25MG  Tablet, Oral) Active. Medications Reconciled  Social History Alcohol use  Moderate alcohol use. Caffeine use  Carbonated beverages, Tea. No drug use  Tobacco use  Never smoker.  Family History Arthritis  Mother. Breast Cancer  Sister. Diabetes Mellitus  Father. Heart Disease  Father, Mother. Hypertension  Father. Kidney Disease  Father. Prostate Cancer  Father.  Pregnancy / Birth History  Age of menopause  57-50 Gravida  1 Maternal age  20-40 Para  1  Other Problems   Anxiety Disorder  Arthritis  Back Pain  Chest pain  Hemorrhoids  High blood pressure     Review of Systems  General Not Present- Appetite Loss, Chills, Fatigue, Fever, Night Sweats, Weight Gain and Weight Loss. Note: All other systems negative (unless as noted in HPI & included Review of Systems) Skin Not Present- Change in Wart/Mole, Dryness, Hives, Jaundice, New Lesions, Non-Healing Wounds, Rash and Ulcer. HEENT Not Present- Earache, Hearing Loss, Hoarseness, Nose Bleed, Oral Ulcers, Ringing in the Ears, Seasonal Allergies, Sinus Pain, Sore Throat, Visual Disturbances, Wears glasses/contact lenses and Yellow Eyes. Respiratory Not Present- Bloody sputum, Chronic Cough, Difficulty Breathing, Snoring and Wheezing. Breast Not Present- Breast Mass, Breast Pain, Nipple Discharge and Skin Changes. Cardiovascular Not Present- Chest Pain, Difficulty Breathing Lying Down, Leg Cramps, Palpitations, Rapid Heart Rate, Shortness of Breath and Swelling of Extremities. Gastrointestinal Not Present- Abdominal Pain, Bloating, Bloody Stool, Change in Bowel Habits, Chronic diarrhea, Constipation, Difficulty Swallowing, Excessive gas, Gets full quickly at meals, Hemorrhoids, Indigestion, Nausea, Rectal Pain and Vomiting. Female Genitourinary Not Present- Frequency, Nocturia, Painful Urination, Pelvic Pain and Urgency. Musculoskeletal Not Present- Back Pain, Joint Pain, Joint Stiffness, Muscle Pain, Muscle Weakness and Swelling of Extremities. Neurological Not Present- Decreased Memory, Fainting, Headaches, Numbness, Seizures, Tingling, Tremor, Trouble walking and Weakness. Psychiatric Not Present- Anxiety, Bipolar, Change in Sleep Pattern, Depression, Fearful and Frequent crying. Endocrine Not Present- Cold Intolerance, Excessive Hunger, Hair Changes, Heat Intolerance, Hot flashes and New Diabetes. Hematology Not Present- Easy Bruising, Excessive bleeding, Gland problems, HIV and Persistent  Infections.  Vitals Weight: 162.25 lb Height: 62in Body Surface Area: 1.75 m Body Mass Index: 29.68 kg/m  Temp.:  97.33F  Pulse: 84 (Regular)  P.OX: 98% (Room air) BP: 160/88(Sitting, Left Arm, Standard)       Physical Exam  General Mental Status-Alert. General Appearance-Consistent with stated age. Hydration-Well hydrated. Voice-Normal.  Head and Neck Head-normocephalic, atraumatic with no lesions or palpable masses. Trachea-midline. Thyroid Gland Characteristics - normal size and consistency.  Eye Eyeball - Bilateral-Extraocular movements intact. Sclera/Conjunctiva - Bilateral-No scleral icterus.  Chest and Lung Exam Chest and lung exam reveals -quiet, even and easy respiratory effort with no use of accessory muscles and on auscultation, normal breath sounds, no adventitious sounds and normal vocal resonance. Inspection Chest Wall - Normal. Back - normal.  Breast Note: There is no palpable mass in either breast. There is no palpable axillary, supraclavicular, or cervical lymphadenopathy. She does have what appears to be inflammation and swelling of the right nipple compared to the left. This extends on to the areola in the upper outer quadrant   Cardiovascular Cardiovascular examination reveals -normal heart sounds, regular rate and rhythm with no murmurs and normal pedal pulses bilaterally.  Abdomen Inspection Inspection of the abdomen reveals - No Hernias. Skin - Scar - no surgical scars. Palpation/Percussion Palpation and Percussion of the abdomen reveal - Soft, Non Tender, No Rebound tenderness, No Rigidity (guarding) and No hepatosplenomegaly. Auscultation Auscultation of the abdomen reveals - Bowel sounds normal.  Neurologic Neurologic evaluation reveals -alert and oriented x 3 with no impairment of recent or remote memory. Mental Status-Normal.  Musculoskeletal Normal Exam - Left-Upper Extremity Strength Normal  and Lower Extremity Strength Normal. Normal Exam - Right-Upper Extremity Strength Normal and Lower Extremity Strength Normal.  Lymphatic Head & Neck  General Head & Neck Lymphatics: Bilateral - Description - Normal. Axillary  General Axillary Region: Bilateral - Description - Normal. Tenderness - Non Tender. Femoral & Inguinal  Generalized Femoral & Inguinal Lymphatics: Bilateral - Description - Normal. Tenderness - Non Tender.    Assessment & Plan  NIPPLE DISCHARGE (N64.52) Impression: The patient has been experiencing nipple pain and bloody discharge for greater than the last month. It has not responded to antibiotics and topical therapy. Because of this I think it would be reasonable to perform a nipple biopsy. Because of the sensitivity of this area I think this should be done in the operating room under anesthesia. I have discussed with her in detail the risks and benefits of the operation as well as some of the technical aspects and she understands and wishes to proceed. This patient encounter took 30 minutes today to perform the following: take history, perform exam, review outside records, interpret imaging, counsel the patient on their diagnosis and document encounter, findings & plan in the EHR

## 2020-07-19 NOTE — Interval H&P Note (Signed)
History and Physical Interval Note:  07/19/2020 11:19 AM  Victoria Allen  has presented today for surgery, with the diagnosis of RIGHT NIPPLE DISCHARGE.  The various methods of treatment have been discussed with the patient and family. After consideration of risks, benefits and other options for treatment, the patient has consented to  Procedure(s): RIGHT BREAST NIPPLE BIOPSY (Right) as a surgical intervention.  The patient's history has been reviewed, patient examined, no change in status, stable for surgery.  I have reviewed the patient's chart and labs.  Questions were answered to the patient's satisfaction.     Autumn Messing III

## 2020-07-19 NOTE — Transfer of Care (Signed)
Immediate Anesthesia Transfer of Care Note  Patient: Victoria Allen  Procedure(s) Performed: RIGHT BREAST NIPPLE BIOPSY (Right Breast)  Patient Location: PACU  Anesthesia Type:General  Level of Consciousness: drowsy and patient cooperative  Airway & Oxygen Therapy: Patient Spontanous Breathing and Patient connected to face mask oxygen  Post-op Assessment: Report given to RN and Post -op Vital signs reviewed and stable  Post vital signs: Reviewed and stable  Last Vitals:  Vitals Value Taken Time  BP 124/75 07/19/20 1210  Temp    Pulse 93 07/19/20 1211  Resp 11 07/19/20 1211  SpO2 97 % 07/19/20 1211  Vitals shown include unvalidated device data.  Last Pain:  Vitals:   07/19/20 1023  TempSrc: Oral  PainSc: 0-No pain         Complications: No complications documented.

## 2020-07-19 NOTE — Anesthesia Procedure Notes (Signed)
Procedure Name: LMA Insertion Date/Time: 07/19/2020 11:40 AM Performed by: Signe Colt, CRNA Pre-anesthesia Checklist: Patient identified, Emergency Drugs available, Suction available and Patient being monitored Patient Re-evaluated:Patient Re-evaluated prior to induction Oxygen Delivery Method: Circle System Utilized Preoxygenation: Pre-oxygenation with 100% oxygen Induction Type: IV induction Ventilation: Mask ventilation without difficulty LMA: LMA inserted LMA Size: 4.0 Number of attempts: 1 Airway Equipment and Method: bite block Placement Confirmation: positive ETCO2 Tube secured with: Tape Dental Injury: Teeth and Oropharynx as per pre-operative assessment

## 2020-07-19 NOTE — Op Note (Signed)
07/19/2020  12:08 PM  PATIENT:  Victoria Allen  54 y.o. female  PRE-OPERATIVE DIAGNOSIS:  RIGHT NIPPLE DISCHARGE  POST-OPERATIVE DIAGNOSIS:  RIGHT NIPPLE DISCHARGE  PROCEDURE:  Procedure(s): RIGHT BREAST NIPPLE BIOPSY (Right)  SURGEON:  Surgeon(s) and Role:    * Jovita Kussmaul, MD - Primary  PHYSICIAN ASSISTANT:   ASSISTANTS: none   ANESTHESIA:   local and general  EBL:  minimal   BLOOD ADMINISTERED:none  DRAINS: none   LOCAL MEDICATIONS USED:  MARCAINE     SPECIMEN:  Source of Specimen:  right nipple tissue  DISPOSITION OF SPECIMEN:  PATHOLOGY  COUNTS:  YES  TOURNIQUET:  * No tourniquets in log *  DICTATION: .Dragon Dictation   After informed consent was obtained the patient was brought to the operating room and placed in the supine position on the operating table.  After adequate induction of general anesthesia the patient's right breast was prepped with ChloraPrep, allowed to dry, and draped in usual sterile manner.  An appropriate timeout was performed.  The area around the right nipple and areola was infiltrated with quarter percent Marcaine.  A small wedge of tissue was taken from the upper outer aspect of the right nipple including a small piece of the areola at the bottom.  This was done with an 11 blade knife.  This tissue was then sent to pathology for further evaluation.  Hemostasis was achieved using the Bovie electrocautery.  The incision was closed with interrupted 5-0 Monocryl stitches.  Antibiotic ointment and sterile dressings were applied.  The patient tolerated the procedure well.  At the end of the case all needle sponge and instrument counts were correct.  The patient was then awakened and taken to recovery in stable condition.  PLAN OF CARE: Discharge to home after PACU  PATIENT DISPOSITION:  PACU - hemodynamically stable.   Delay start of Pharmacological VTE agent (>24hrs) due to surgical blood loss or risk of bleeding: not applicable

## 2020-07-22 ENCOUNTER — Other Ambulatory Visit: Payer: Self-pay

## 2020-07-22 ENCOUNTER — Encounter (HOSPITAL_BASED_OUTPATIENT_CLINIC_OR_DEPARTMENT_OTHER): Payer: Self-pay | Admitting: General Surgery

## 2020-07-24 LAB — SURGICAL PATHOLOGY

## 2020-07-26 MED FILL — SHINGRIX 50 MCG SUS: 50 | 1 days supply | Qty: 1 | Fill #0 | Status: TO

## 2020-07-26 MED FILL — POTASSIUM CHLORIDE CRYS ER: 20 | 30 days supply | Qty: 30 | Fill #2

## 2020-07-26 MED FILL — SHINGRIX 50 MCG SUS: 50 | 1 days supply | Qty: 1 | Fill #0

## 2020-07-31 ENCOUNTER — Ambulatory Visit
Admission: RE | Admit: 2020-07-31 | Discharge: 2020-07-31 | Disposition: A | Discharge status: Home / Self Care | Payer: 59 | Source: Ambulatory Visit | Attending: Family Medicine | Admitting: Family Medicine

## 2020-07-31 ENCOUNTER — Other Ambulatory Visit: Payer: Self-pay

## 2020-07-31 ENCOUNTER — Other Ambulatory Visit (HOSPITAL_COMMUNITY)
Admission: RE | Admit: 2020-07-31 | Discharge: 2020-07-31 | Disposition: A | Discharge status: Home / Self Care | Payer: 59 | Source: Ambulatory Visit | Attending: Family Medicine | Admitting: Family Medicine

## 2020-07-31 ENCOUNTER — Other Ambulatory Visit: Payer: Self-pay | Admitting: Family Medicine

## 2020-07-31 DIAGNOSIS — R599 Enlarged lymph nodes, unspecified: Secondary | ICD-10-CM

## 2020-07-31 DIAGNOSIS — R590 Localized enlarged lymph nodes: Secondary | ICD-10-CM | POA: Diagnosis not present

## 2020-08-01 LAB — SURGICAL PATHOLOGY

## 2020-08-03 ENCOUNTER — Ambulatory Visit: Payer: 59

## 2020-08-09 ENCOUNTER — Other Ambulatory Visit: Payer: 59

## 2020-08-09 DIAGNOSIS — I10 Essential (primary) hypertension: Secondary | ICD-10-CM | POA: Diagnosis not present

## 2020-08-09 DIAGNOSIS — R42 Dizziness and giddiness: Secondary | ICD-10-CM | POA: Diagnosis not present

## 2020-08-09 DIAGNOSIS — J309 Allergic rhinitis, unspecified: Secondary | ICD-10-CM | POA: Diagnosis not present

## 2020-08-17 ENCOUNTER — Ambulatory Visit: Payer: 59

## 2020-09-04 MED FILL — POTASSIUM CHLORIDE CRYS ER: 20 | 30 days supply | Qty: 30 | Fill #3

## 2020-09-13 ENCOUNTER — Encounter: Payer: Self-pay | Admitting: Neurology

## 2020-09-19 MED FILL — OLMESARTAN-HCTZ 40-12.5 MG: 40-12.5 | 90 days supply | Qty: 90 | Fill #2

## 2020-10-14 MED FILL — POTASSIUM CHLORIDE CRYS ER: 20 | 30 days supply | Qty: 30 | Fill #4

## 2020-11-11 NOTE — Progress Notes (Signed)
NEUROLOGY CONSULTATION NOTE  Victoria Allen MRN: [id number] DOB: Dec 23, 1965  Referring provider: Donald Prose, MD Primary care provider: Donald Prose, MD  Reason for consult:  vertigo  Assessment/Plan:   1.  Chronic vertigo - at this time, I still suspect benign paroxysmal positional vertigo, bilaterally.  Findings on exam are suggestive of peripheral etiology.  No other focal abnormalities on neurologic exam.   2.  Hypertension - elevated today.  She did not take her medication this morning.  1.  I will first refer her to physical therapy for vestibular rehab.  If exam findings are suspicious or if she does not respond to treatment, we will investigate for other causes. 2.  She plans to take her blood pressure medication and recheck blood pressure later.  If still elevated, she was advised to contact her PCP. 3.  Follow up after PT/vestibular rehab   Subjective:  Victoria Allen is a 55 year old right-handed female with HTN who presents for vertigo.  History supplemented by referring provider's note.  She has had intermittent vertigo since 2014.  Her typical vertigo is spinning sensation with nausea, either positional or spontaneous, lasting for 10 seconds.  Each bout lasts 1 to 2 days and occurs every 2 months.  She treats with meclizine which helps.  In late November 2021, she began experiencing a different type of dizziness.  When she is laying supine and turns her head to the right, she experiences a "swimmy" feeling in her head, but not her traditional spinning.  There is associated nausea.  It lasts for 2-3 seconds.  No neck pain, visual disturbance, vomiting, aural fullness, otalgia, tinnitus or hearing loss.  It occurs every other day.  She takes meclizine and tried self-performed Epley maneuver, but symptoms have not subsided. She sometimes feels "cloudy" in her head but denies any headache.  CBC and CMP from December were normal.  TSH was normal (0.36).    Remote MRI  of brain without contrast performed on 10/22/2007 to evaluate headache was personally reviewed and showed few nonspecific tiny T2/FLAIR foci in the cerebral white matter bilaterally but otherwise unremarkable.  PAST MEDICAL HISTORY: Past Medical History:  Diagnosis Date  . Allergic rhinitis   . Anemia   . Dysmenorrhea   . Fibroids    with dysmenorrhea  . Herniated disc   . Hypertension   . Varicella infection     PAST SURGICAL HISTORY: Past Surgical History:  Procedure Laterality Date  . ABDOMINAL HYSTERECTOMY    . ANTERIOR CERVICAL DECOMP/DISCECTOMY FUSION N/A 07/17/2015   Procedure: Anterior Cervical Decompression Fusion Cervical five-six,Cervical six-seven ;  Surgeon: Eustace Moore, MD;  Location: [place] [facility];  Service: Neurosurgery;  Laterality: N/A;  . BREAST BIOPSY Right 07/19/2020   Procedure: RIGHT BREAST NIPPLE BIOPSY;  Surgeon: Jovita Kussmaul, MD;  Location: [place];  Service: General;  Laterality: Right;  . NASAL SINUS SURGERY      MEDICATIONS: Current Outpatient Medications on File Prior to Visit  Medication Sig Dispense Refill  . fexofenadine (ALLEGRA) 180 MG tablet Take 180 mg by mouth daily.    . MECLIZINE HCL PO Take by mouth as needed.    Marland Kitchen olmesartan-hydrochlorothiazide (BENICAR HCT) 40-12.5 MG tablet Take 1 tablet by mouth daily.    . Potassium 75 MG TABS Take by mouth.    . traMADol (ULTRAM) 50 MG tablet Take 1-2 tablets (50-100 mg total) by mouth every 6 (six) hours as needed. 20 tablet 1  No current facility-administered medications on file prior to visit.    ALLERGIES: Allergies  Allergen Reactions  . Codeine Itching  . Penicillins Hives    Has patient had a PCN reaction causing immediate rash, facial/tongue/throat swelling, SOB or lightheadedness with hypotension: No Has patient had a PCN reaction causing severe rash involving mucus membranes or skin necrosis: No Has patient had a PCN reaction that required hospitalization  No Has patient had a PCN reaction occurring within the last 10 years: No If all of the above answers are "NO", then may proceed with Cephalosporin use.     FAMILY HISTORY: Family History  Problem Relation Age of Onset  . Hypertension Mother   . Heart attack Father   . Cancer - Prostate Father   . Hypertension Father   . Diabetes Father   . Hypertension Maternal Grandmother   . Cancer - Colon Maternal Grandmother   . Hypertension Paternal Grandmother   . Heart attack Paternal Grandmother   . Breast cancer Sister     Objective:  Blood pressure (!) 175/100, pulse 92, resp. rate 18, height 5\' 2"  (1.575 m), weight 159 lb (72.1 kg), SpO2 98 %. General: No acute distress.  Patient appears well-groomed.   Head:  Normocephalic/atraumatic Eyes:  fundi examined but not visualized Neck: supple, no paraspinal tenderness, full range of motion Back: No paraspinal tenderness Heart: regular rate and rhythm Lungs: Clear to auscultation bilaterally. Vascular: No carotid bruits. Neurological Exam: Mental status: alert and oriented to person, place, and time, recent and remote memory intact, fund of knowledge intact, attention and concentration intact, speech fluent and not dysarthric, language intact. Cranial nerves: CN I: not tested CN II: pupils equal, round and reactive to light, visual fields intact CN III, IV, VI:  full range of motion, no nystagmus, no ptosis CN V: facial sensation intact. CN VII: upper and lower face symmetric CN VIII: hearing intact CN IX, X: gag intact, uvula midline CN XI: sternocleidomastoid and trapezius muscles intact CN XII: tongue midline Bulk & Tone: normal, no fasciculations. Motor:  muscle strength 5/5 throughout Sensation:  temperature and vibratory sensation intact. Deep Tendon Reflexes:  2+ throughout,  toes downgoing.   Finger to nose testing:  Without dysmetria.   Heel to shin:  Without dysmetria.   Gait:  Normal station and stride.  Romberg  negative. Dix-Hallpike:  Positive dizziness bilaterally, with slight rotational nystagmus to the left, not really appreciated to the right. Head Impulse Test: positive bilaterally    Thank you for allowing me to take part in the care of this patient.  Metta Clines, DO  CC: Donald Prose, MD

## 2020-11-12 ENCOUNTER — Encounter: Payer: Self-pay | Admitting: Neurology

## 2020-11-12 ENCOUNTER — Other Ambulatory Visit: Payer: Self-pay

## 2020-11-12 ENCOUNTER — Ambulatory Visit: Payer: 59 | Admitting: Neurology

## 2020-11-12 VITALS — BP 175/100 | HR 92 | Resp 18 | Ht 62.0 in | Wt 159.0 lb

## 2020-11-12 DIAGNOSIS — H8113 Benign paroxysmal vertigo, bilateral: Secondary | ICD-10-CM

## 2020-11-12 DIAGNOSIS — I10 Essential (primary) hypertension: Secondary | ICD-10-CM | POA: Diagnosis not present

## 2020-11-12 NOTE — Patient Instructions (Signed)
At this point, I still suspect typical vertigo. I want to first send you to physical therapy for vestibular rehabilitation.  If treatment is not effective or if there are findings on the physical therapist's exam that is suspicious for another cause, then we would pursue further investigation  Follow up after physical therapy.

## 2020-11-27 MED FILL — POTASSIUM CHLORIDE CRYS ER: 20 | 30 days supply | Qty: 30 | Fill #5

## 2020-11-28 ENCOUNTER — Other Ambulatory Visit: Payer: Self-pay

## 2020-11-28 ENCOUNTER — Ambulatory Visit: Payer: 59 | Attending: Neurology | Admitting: Physical Therapy

## 2020-11-28 ENCOUNTER — Encounter: Payer: Self-pay | Admitting: Physical Therapy

## 2020-11-28 DIAGNOSIS — H8111 Benign paroxysmal vertigo, right ear: Secondary | ICD-10-CM | POA: Diagnosis not present

## 2020-11-28 DIAGNOSIS — R42 Dizziness and giddiness: Secondary | ICD-10-CM | POA: Diagnosis not present

## 2020-11-28 NOTE — Therapy (Signed)
[facility] [address] [place], [place], [address] Phone: [phone]   Fax:  [phone]  Physical Therapy Evaluation  Patient Details  Name: Victoria Allen MRN: [id number] Date of Birth: October 13, 1965 Referring Provider (PT): Metta Clines, DO   Encounter Date: 11/28/2020   PT End of Session - 11/28/20 2044    Visit Number 1    Number of Visits 3    Date for PT Re-Evaluation 01/10/21    Authorization Type [facility]    PT Start Time 1324    PT Stop Time 1615    PT Time Calculation (min) 45 min    Activity Tolerance Patient tolerated treatment well    Behavior During Therapy [facility] for tasks assessed/performed           Past Medical History:  Diagnosis Date  . Allergic rhinitis   . Anemia   . Dysmenorrhea   . Fibroids    with dysmenorrhea  . Herniated disc   . Hypertension   . Varicella infection     Past Surgical History:  Procedure Laterality Date  . ABDOMINAL HYSTERECTOMY    . ANTERIOR CERVICAL DECOMP/DISCECTOMY FUSION N/A 07/17/2015   Procedure: Anterior Cervical Decompression Fusion Cervical five-six,Cervical six-seven ;  Surgeon: Eustace Moore, MD;  Location: [facility];  Service: Neurosurgery;  Laterality: N/A;  . BREAST BIOPSY Right 07/19/2020   Procedure: RIGHT BREAST NIPPLE BIOPSY;  Surgeon: Jovita Kussmaul, MD;  Location: [facility];  Service: General;  Laterality: Right;  . NASAL SINUS SURGERY      There were no vitals filed for this visit.    Subjective Assessment - 11/28/20 1528    Subjective Pt states she has had vertigo intermittently since 2014 but states what concerns her is when she lies on her back and turns her head to the right it feels as if it could start as a full blown episode; reports some mild nausea accompanying it.  Most recent full blown episode occurred about 3 weeks ago; took Meclizine so it lasted 2-3 days ago.  Denies hearing loss and tinnitus.     Patient Stated Goals Resolve the dizziness    Currently in Pain? No/denies              [facility] PT Assessment - 11/28/20 0001      Assessment   Medical Diagnosis BPPV    Referring Provider (PT) Metta Clines, DO    Onset Date/Surgical Date --   Dec. 2021   Prior Therapy none      Precautions   Precautions None      Balance Screen   Has the patient fallen in the past 6 months No    Has the patient had a decrease in activity level because of a fear of falling?  No    Is the patient reluctant to leave their home because of a fear of falling?  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      Observation/Other Assessments   Focus on [place] ([place])  [place] primary measure 46/100; risk adjusted 50/100    Other Surveys  --   DFS 54/100                 Vestibular Assessment - 11/28/20 0001      Symptom Behavior  Subjective history of current problem pt reports intermittent dizziness since 2014; reports most recent episode occurred about 3 weeks ago    Type of Dizziness  Spinning    Frequency of Dizziness depends on the movement    Duration of Dizziness secs to mins    Symptom Nature Positional    Aggravating Factors Rolling to right   lying supine - turning head to Rt side   Relieving Factors Lying supine;Head stationary    Progression of Symptoms Worse    History of similar episodes had episode in Dec. 2021      Oculomotor Exam   Oculomotor Alignment Normal    Spontaneous Absent    Smooth Pursuits Intact    Saccades Intact      Positional Testing   Dix-Hallpike Dix-Hallpike Right;Dix-Hallpike Left    Sidelying Test Sidelying Right;Sidelying Left      Dix-Hallpike Right   Dix-Hallpike Right Duration approx. 15 secs    Dix-Hallpike Right Symptoms No nystagmus      Dix-Hallpike Left   Dix-Hallpike Left Duration none    Dix-Hallpike Left  Symptoms No nystagmus      Sidelying Right   Sidelying Right Duration none    Sidelying Right Symptoms No nystagmus      Sidelying Left   Sidelying Left Duration none    Sidelying Left Symptoms No nystagmus              Objective measurements completed on examination: See above findings.        Vestibular Treatment/Exercise - 11/28/20 0001      Vestibular Treatment/Exercise   Vestibular Treatment Provided Canalith Repositioning    Canalith Repositioning Epley Manuever Right       EPLEY MANUEVER RIGHT   Number of Reps  3    Overall Response Improved Symptoms    Response Details  no nystagmus was noted in any of the 3 reps of Epley's                 PT Education - 11/28/20 2042    Education Details gave article from [place] on BPPV    Person(s) Educated Patient    Methods Explanation;Handout    Comprehension Verbalized understanding               PT Long Term Goals - 11/28/20 2050      PT LONG TERM GOAL #1   Title Pt will have (-) Rt Dix-Hallpike test with no c/o vertigo in test position to indicate resolution of Rt BPPV.    Time 3    Period Weeks    Status New    Target Date 01/03/21      PT LONG TERM GOAL #2   Title Independent in HEP for habituation and self treatment prn.    Time 4    Period Weeks    Status New    Target Date 01/03/21      PT LONG TERM GOAL #3   Title Improve FOTO score from 46/100 to >/= 56/100 to demo improvement in dizziness.    Baseline 46/100    Time 3    Period Weeks    Status New    Target Date 01/03/21                  Plan - 11/28/20 2045    Clinical Impression Statement Pt had symptoms consistent with Rt BPPV, however, no nystagmus was noted with any positional testing.  Epley maneuver for Rt BPPV was performed for  3 reps with improvement noted on 3rd rep - BPPV appeared to be resolved.    Personal Factors and Comorbidities Comorbidity 1;Time since onset of injury/illness/exacerbation     Comorbidities s/p anterior cervical discectomy/ fusion in Nov. 2016    Examination-Activity Limitations Bed Mobility;Sleep;Locomotion Level    Examination-Participation Restrictions Cleaning;Laundry;Shop;Occupation    Stability/Clinical Decision Making Stable/Uncomplicated    Clinical Decision Making Low    Rehab Potential Good    PT Frequency 1x / week    PT Duration 3 weeks    PT Treatment/Interventions Canalith Repostioning;ADLs/Self Care Home Management;Patient/family education;Neuromuscular re-education    PT Next Visit Plan recheck Rt BPPV           Patient will benefit from skilled therapeutic intervention in order to improve the following deficits and impairments:  Dizziness  Visit Diagnosis: BPPV (benign paroxysmal positional vertigo), right - Plan: PT plan of care cert/re-cert  Dizziness and giddiness - Plan: PT plan of care cert/re-cert     Problem List Patient Active Problem List   Diagnosis Date Noted  . Essential hypertension 12/28/2016  . Abnormal ECG 12/28/2016  . S/P cervical spinal fusion 07/17/2015    Alda Lea, PT 11/28/2020, 8:59 PM  [facility] [address] [place], [place], [address] Phone: [phone]   Fax:  [phone]  Name: ANJOLI DIEMER MRN: [id number] Date of Birth: 1966/03/17

## 2020-11-30 DIAGNOSIS — H5213 Myopia, bilateral: Secondary | ICD-10-CM | POA: Diagnosis not present

## 2020-12-09 ENCOUNTER — Other Ambulatory Visit (HOSPITAL_COMMUNITY): Payer: Self-pay

## 2020-12-09 DIAGNOSIS — L2089 Other atopic dermatitis: Secondary | ICD-10-CM | POA: Diagnosis not present

## 2020-12-09 MED ORDER — HYDROCORTISONE 2.5 % EX OINT
TOPICAL_OINTMENT | CUTANEOUS | 1 refills | Status: AC
  Filled 2020-12-09: qty 454, 30d supply, fill #0

## 2020-12-10 ENCOUNTER — Other Ambulatory Visit (HOSPITAL_COMMUNITY): Payer: Self-pay

## 2020-12-26 ENCOUNTER — Encounter: Payer: Self-pay | Admitting: Physical Therapy

## 2021-01-02 ENCOUNTER — Other Ambulatory Visit (HOSPITAL_COMMUNITY): Payer: Self-pay

## 2021-01-02 MED FILL — Potassium Chloride Microencapsulated Crys ER Tab 20 mEq: ORAL | 30 days supply | Qty: 30 | Fill #0 | Status: AC

## 2021-01-02 MED FILL — Olmesartan Medoxomil-Hydrochlorothiazide Tab 40-12.5 MG: ORAL | 90 days supply | Qty: 90 | Fill #0 | Status: AC

## 2021-01-06 ENCOUNTER — Other Ambulatory Visit (HOSPITAL_COMMUNITY): Payer: Self-pay

## 2021-02-07 ENCOUNTER — Other Ambulatory Visit (HOSPITAL_COMMUNITY): Payer: Self-pay

## 2021-02-07 MED FILL — Potassium Chloride Microencapsulated Crys ER Tab 20 mEq: ORAL | 30 days supply | Qty: 30 | Fill #1 | Status: AC

## 2021-02-20 ENCOUNTER — Other Ambulatory Visit: Payer: Self-pay | Admitting: Family Medicine

## 2021-02-20 DIAGNOSIS — Z1231 Encounter for screening mammogram for malignant neoplasm of breast: Secondary | ICD-10-CM

## 2021-02-26 DIAGNOSIS — R0683 Snoring: Secondary | ICD-10-CM | POA: Diagnosis not present

## 2021-02-26 DIAGNOSIS — R400 Somnolence: Secondary | ICD-10-CM | POA: Diagnosis not present

## 2021-03-06 DIAGNOSIS — I10 Essential (primary) hypertension: Secondary | ICD-10-CM | POA: Diagnosis not present

## 2021-03-06 DIAGNOSIS — G4719 Other hypersomnia: Secondary | ICD-10-CM | POA: Diagnosis not present

## 2021-03-14 ENCOUNTER — Other Ambulatory Visit (HOSPITAL_BASED_OUTPATIENT_CLINIC_OR_DEPARTMENT_OTHER): Payer: Self-pay

## 2021-03-14 DIAGNOSIS — R0683 Snoring: Secondary | ICD-10-CM

## 2021-03-14 DIAGNOSIS — G4719 Other hypersomnia: Secondary | ICD-10-CM

## 2021-03-14 DIAGNOSIS — R0681 Apnea, not elsewhere classified: Secondary | ICD-10-CM

## 2021-03-19 ENCOUNTER — Other Ambulatory Visit (HOSPITAL_COMMUNITY): Payer: Self-pay

## 2021-03-19 MED FILL — Potassium Chloride Microencapsulated Crys ER Tab 20 mEq: ORAL | 30 days supply | Qty: 30 | Fill #2 | Status: AC

## 2021-03-26 DIAGNOSIS — R42 Dizziness and giddiness: Secondary | ICD-10-CM | POA: Diagnosis not present

## 2021-03-26 DIAGNOSIS — E876 Hypokalemia: Secondary | ICD-10-CM | POA: Diagnosis not present

## 2021-03-26 DIAGNOSIS — Z0000 Encounter for general adult medical examination without abnormal findings: Secondary | ICD-10-CM | POA: Diagnosis not present

## 2021-03-26 DIAGNOSIS — I10 Essential (primary) hypertension: Secondary | ICD-10-CM | POA: Diagnosis not present

## 2021-03-26 DIAGNOSIS — Z13220 Encounter for screening for lipoid disorders: Secondary | ICD-10-CM | POA: Diagnosis not present

## 2021-03-27 ENCOUNTER — Other Ambulatory Visit (HOSPITAL_COMMUNITY): Payer: Self-pay

## 2021-03-27 MED ORDER — OLMESARTAN MEDOXOMIL-HCTZ 40-12.5 MG PO TABS
1.0000 | ORAL_TABLET | Freq: Every day | ORAL | 3 refills | Status: DC
  Filled 2021-03-27 – 2021-04-08 (×2): qty 90, 90d supply, fill #0
  Filled 2021-07-03: qty 90, 90d supply, fill #1
  Filled 2021-10-10: qty 90, 90d supply, fill #2
  Filled 2022-01-13: qty 90, 90d supply, fill #3

## 2021-03-27 MED ORDER — POTASSIUM CHLORIDE CRYS ER 20 MEQ PO TBCR
20.0000 meq | EXTENDED_RELEASE_TABLET | Freq: Every day | ORAL | 2 refills | Status: AC
  Filled 2021-03-27 – 2021-04-21 (×2): qty 90, 90d supply, fill #0
  Filled 2021-07-28: qty 90, 90d supply, fill #1
  Filled 2021-11-18 – 2021-12-01 (×2): qty 90, 90d supply, fill #2

## 2021-03-30 ENCOUNTER — Ambulatory Visit (HOSPITAL_BASED_OUTPATIENT_CLINIC_OR_DEPARTMENT_OTHER): Payer: 59 | Attending: Sleep Medicine | Admitting: Sleep Medicine

## 2021-03-30 ENCOUNTER — Other Ambulatory Visit: Payer: Self-pay

## 2021-03-30 VITALS — Ht 62.0 in | Wt 160.0 lb

## 2021-03-30 DIAGNOSIS — G4733 Obstructive sleep apnea (adult) (pediatric): Secondary | ICD-10-CM | POA: Diagnosis not present

## 2021-03-30 DIAGNOSIS — R0683 Snoring: Secondary | ICD-10-CM

## 2021-03-30 DIAGNOSIS — R0681 Apnea, not elsewhere classified: Secondary | ICD-10-CM

## 2021-03-30 DIAGNOSIS — G4719 Other hypersomnia: Secondary | ICD-10-CM

## 2021-04-04 ENCOUNTER — Other Ambulatory Visit (HOSPITAL_COMMUNITY): Payer: Self-pay

## 2021-04-05 DIAGNOSIS — G4733 Obstructive sleep apnea (adult) (pediatric): Secondary | ICD-10-CM | POA: Diagnosis not present

## 2021-04-05 NOTE — Procedures (Signed)
   NAME: Victoria Allen DATE OF BIRTH:  Feb 20, 1966 MEDICAL RECORD NUMBER [id number]  LOCATION: [facility]  PHYSICIAN: Brendt Dible D Raenell Mensing  DATE OF STUDY: 03/30/2021  SLEEP STUDY TYPE: Nocturnal Polysomnogram               REFERRING PHYSICIAN: Elmarie Mainland, MD  CLINICAL INFORMATION [name] is a 55 year old Female and was referred to the sleep center for evaluation of obstructive sleep apnea.  MEDICATIONS Patient self administered medications include: N/A. No sleep medicine administered.  SLEEP STUDY TECHNIQUE A multi-channel overnight Polysomnography study was performed. The channels recorded and monitored were central and occipital EEG, electrooculogram (EOG), submentalis EMG (chin), nasal and oral airflow, thoracic and abdominal wall motion, anterior tibialis EMG, snore microphone, electrocardiogram, and a pulse oximetry. SLEEP ARCHITECTURE The study was initiated at 9:31:59 PM and terminated at 4:28:11 AM. The total recorded time was 416.2 minutes. EEG confirmed total sleep time was 243.5 minutes yielding a sleep efficiency of 58.5%%. Sleep onset after lights out was 70.5 minutes with a REM latency of 151.0 minutes. The patient spent 9.9%% of the night in stage N1 sleep, 58.7%% in stage N2 sleep, 0.0%% in stage N3 and 31.4% in REM. Wake after sleep onset (WASO) was 102.2 minutes. The Arousal Index was 42.9/hour. RESPIRATORY PARAMETERS There were a total of 13 respiratory disturbances out of which 1 were apneas (0 obstructive, 0 mixed, 1 central) and 12 hypopneas. The apnea/hypopnea index (AHI) was 3.2 events/hour. The central sleep apnea index was 0.2 events/hour. The REM AHI was 9.4 events/hour and NREM AHI was 0.4 events/hour. The supine AHI was 3.2 events/hour and the non supine AHI was 3.2 supine during 22.79% of sleep. Respiratory disturbance index was 27.4 events/hour, associated with oxygen desaturation down to a nadir of 88.0% during sleep. The  mean oxygen saturation during the study was 97.1%.  LEG MOVEMENT DATA The total leg movements were 28 with a resulting leg movement index of 6.9/hr. Associated arousal with leg movement index was 0.0/hr.  CARDIAC DATA The underlying cardiac rhythm was most consistent with sinus rhythm. Mean heart rate during sleep was 65.6 bpm. Additional rhythm abnormalities include None. IMPRESSIONS - Moderate Obstructive Sleep apnea (OSA) - Moderate amount of RERA's noted. DIAGNOSIS - Moderate Obstructive Sleep Apnea based on RDI (G47.33) RECOMMENDATIONS - Therapeutic CPAP titration to determine optimal pressure required to alleviate sleep disordered breathing. - Avoid alcohol, sedatives and other CNS depressants that may worsen sleep apnea and disrupt normal sleep architecture. - Sleep hygiene should be reviewed to assess factors that may improve sleep quality. - Weight management and regular exercise should be initiated or continued.  Michalene Debruler D Luise Yamamoto Diplomate, American Board of Internal Medicine  ELECTRONICALLY SIGNED ON:  04/05/2021, 2:46 PM [place] PH: [phone]   FX: [phone] [place]

## 2021-04-08 ENCOUNTER — Other Ambulatory Visit (HOSPITAL_COMMUNITY): Payer: Self-pay

## 2021-04-10 ENCOUNTER — Other Ambulatory Visit (HOSPITAL_COMMUNITY): Payer: Self-pay

## 2021-04-21 ENCOUNTER — Other Ambulatory Visit (HOSPITAL_COMMUNITY): Payer: Self-pay

## 2021-05-28 ENCOUNTER — Encounter (HOSPITAL_BASED_OUTPATIENT_CLINIC_OR_DEPARTMENT_OTHER): Payer: 59 | Admitting: Sleep Medicine

## 2021-07-03 ENCOUNTER — Other Ambulatory Visit (HOSPITAL_COMMUNITY): Payer: Self-pay

## 2021-07-03 ENCOUNTER — Other Ambulatory Visit: Payer: Self-pay

## 2021-07-03 ENCOUNTER — Ambulatory Visit
Admission: RE | Admit: 2021-07-03 | Discharge: 2021-07-03 | Disposition: A | Discharge status: Home / Self Care | Payer: 59 | Source: Ambulatory Visit | Attending: Family Medicine | Admitting: Family Medicine

## 2021-07-03 DIAGNOSIS — Z1231 Encounter for screening mammogram for malignant neoplasm of breast: Secondary | ICD-10-CM

## 2021-07-21 ENCOUNTER — Other Ambulatory Visit (HOSPITAL_COMMUNITY): Payer: Self-pay

## 2021-07-22 ENCOUNTER — Other Ambulatory Visit (HOSPITAL_COMMUNITY): Payer: Self-pay

## 2021-07-22 MED ORDER — MECLIZINE HCL 25 MG PO TABS
25.0000 mg | ORAL_TABLET | Freq: Three times a day (TID) | ORAL | 0 refills | Status: AC | PRN
  Filled 2021-07-22: qty 30, 10d supply, fill #0

## 2021-07-23 DIAGNOSIS — R519 Headache, unspecified: Secondary | ICD-10-CM | POA: Diagnosis not present

## 2021-07-23 DIAGNOSIS — I10 Essential (primary) hypertension: Secondary | ICD-10-CM | POA: Diagnosis not present

## 2021-07-23 DIAGNOSIS — M79605 Pain in left leg: Secondary | ICD-10-CM | POA: Diagnosis not present

## 2021-07-23 DIAGNOSIS — R509 Fever, unspecified: Secondary | ICD-10-CM | POA: Diagnosis not present

## 2021-07-28 ENCOUNTER — Other Ambulatory Visit (HOSPITAL_COMMUNITY): Payer: Self-pay

## 2021-10-10 ENCOUNTER — Other Ambulatory Visit (HOSPITAL_COMMUNITY): Payer: Self-pay

## 2021-10-13 ENCOUNTER — Other Ambulatory Visit (HOSPITAL_COMMUNITY): Payer: Self-pay

## 2021-10-14 ENCOUNTER — Other Ambulatory Visit (HOSPITAL_COMMUNITY): Payer: Self-pay

## 2021-11-06 IMAGING — US A
1 series · 4 of 4 positions shown · non-contrast
Comparison: Previous exam(s).
COMPARISON: Previous exam(s).

Addendum:
CLINICAL DATA: 54-year-old female with bloody RIGHT nipple
discharge and for annual bilateral mammogram.

EXAM:
DIGITAL DIAGNOSTIC BILATERAL MAMMOGRAM WITH CAD AND TOMO
ULTRASOUND RIGHT BREAST

[Series 1: a · 0.07mm/px · 4 of 4 slices shown]
[im 1/4]
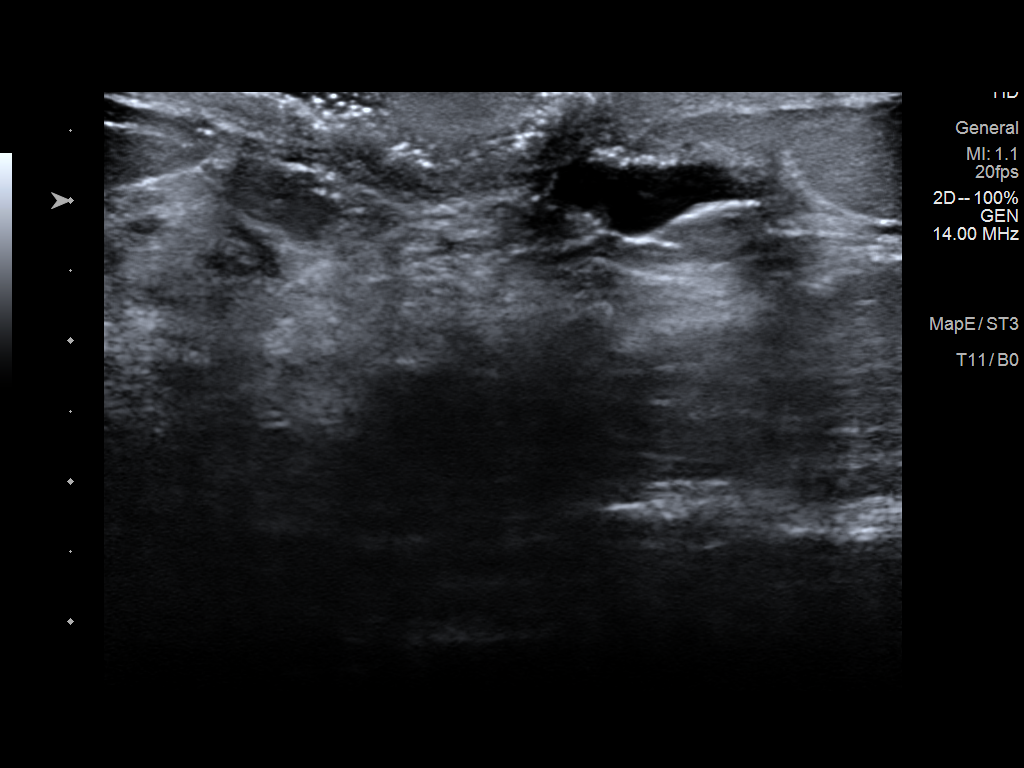
[im 2/4]
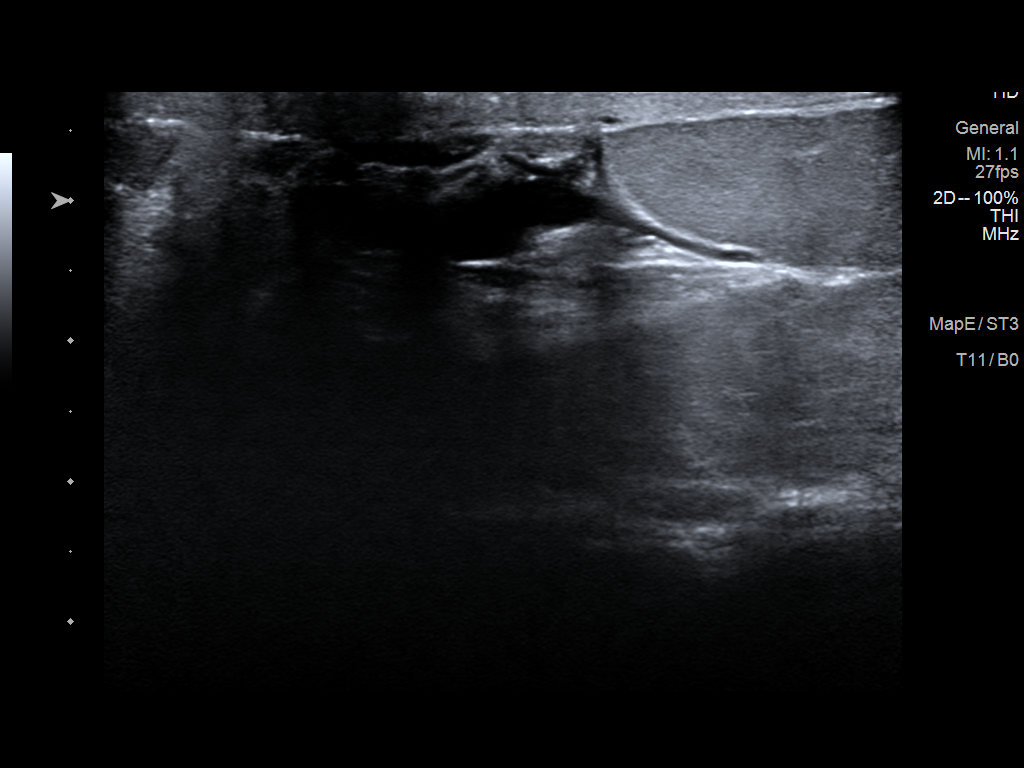
[im 3/4]
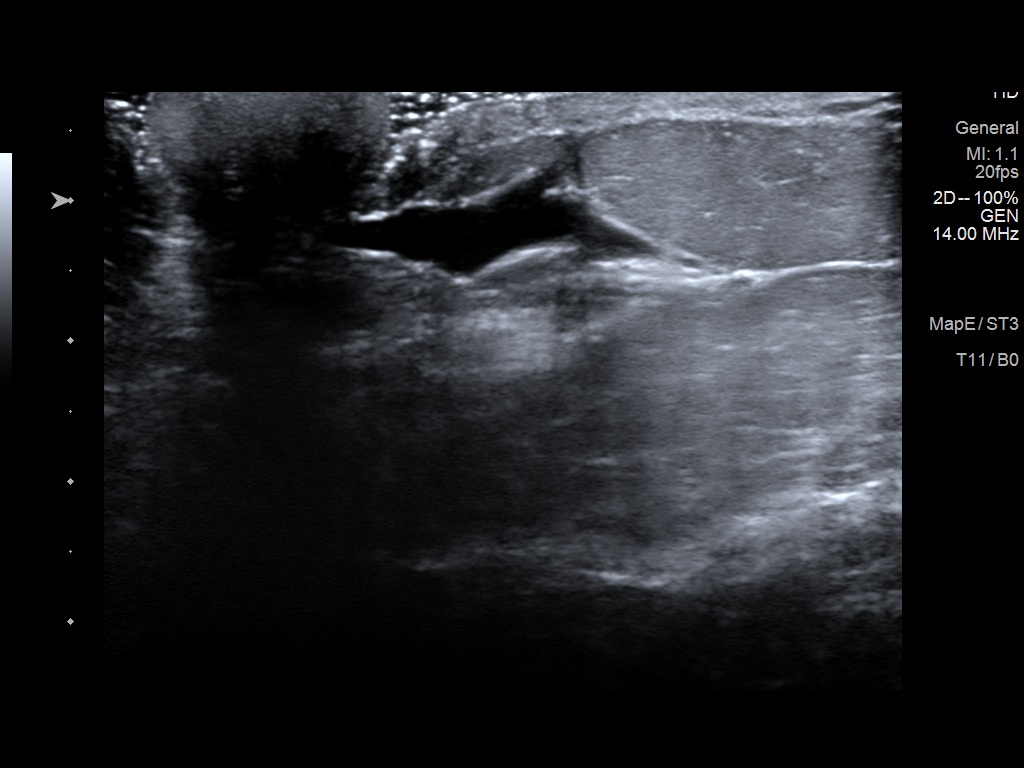
[im 4/4]
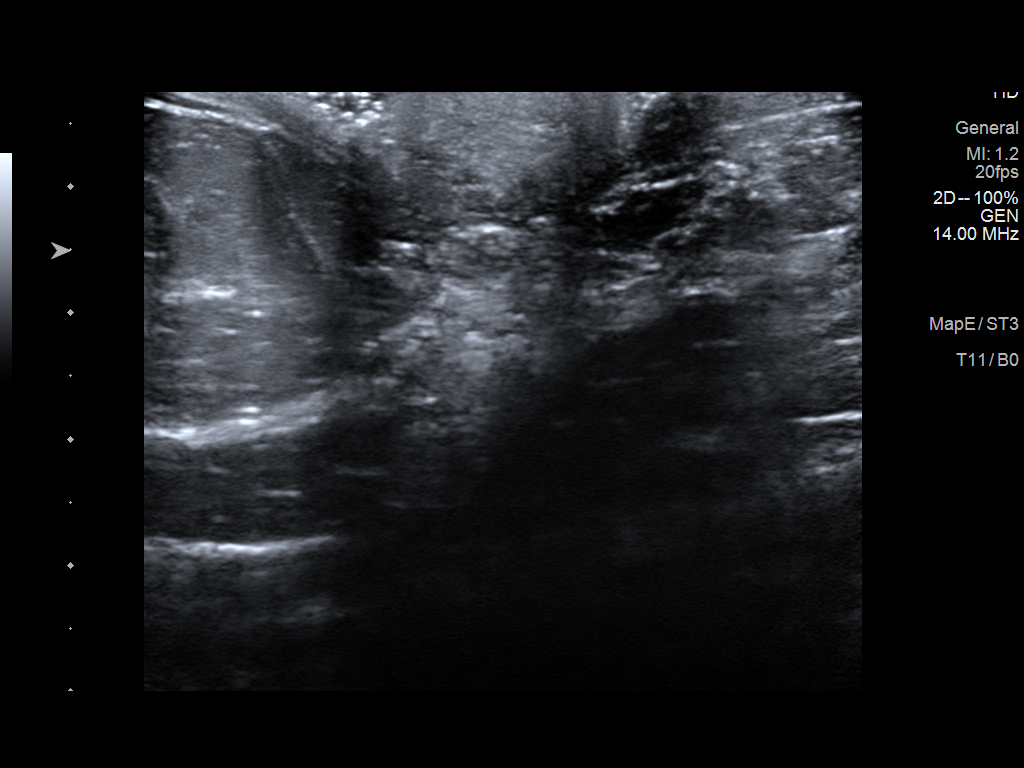

[4 of 4 positions shown; findings below may reference images not displayed]

ACR Breast Density Category c: The breast tissue is heterogeneously
dense, which may obscure small masses.
FINDINGS: 2D/3D full field views of both breasts and spot compression view of
the RETROAREOLAR RIGHT breast demonstrate no suspicious abnormality
in the RETROAREOLAR RIGHT breast.

No other suspicious mass, distortion or worrisome calcifications
noted within either breast.

Mammographic images were processed with CAD.

On physical exam, no discharge could be elicited by the patient. The
RIGHT nipple has a similar appearance to the LEFT without definite
erythema or scaling.

Targeted ultrasound is performed, showing mild duct ectasia in the
RETROAREOLAR RIGHT breast without suspicious mass, distortion or
worrisome calcifications.
IMPRESSION: 1. No mammographic or sonographic abnormality to explain this
patient's bloody nipple discharge. MRI and surgical consultation is
recommended.
2. Mild RIGHT breast duct ectasia without intraductal abnormality.
3. No mammographic evidence of breast malignancy.

RECOMMENDATION:
1. Bilateral breast MRI with and without contrast for evaluation of
bloody RIGHT nipple discharge.
2. Surgical consultation.

I have discussed the findings and recommendations with the patient.
If applicable, a reminder letter will be sent to the patient
regarding the next appointment.

BI-RADS CATEGORY  2: Benign.

ADDENDUM:
Surgical consultation has been arranged with Dr. Dr. Kethian Rutell
At [REDACTED] on August 02, 2020.

Mforbi Tjang RN on 07/03/2020

*** End of Addendum ***
ACR Breast Density Category c: The breast tissue is heterogeneously
dense, which may obscure small masses.
FINDINGS: 2D/3D full field views of both breasts and spot compression view of
the RETROAREOLAR RIGHT breast demonstrate no suspicious abnormality
in the RETROAREOLAR RIGHT breast.

No other suspicious mass, distortion or worrisome calcifications
noted within either breast.

Mammographic images were processed with CAD.

On physical exam, no discharge could be elicited by the patient. The
RIGHT nipple has a similar appearance to the LEFT without definite
erythema or scaling.

Targeted ultrasound is performed, showing mild duct ectasia in the
RETROAREOLAR RIGHT breast without suspicious mass, distortion or
worrisome calcifications.
IMPRESSION: 1. No mammographic or sonographic abnormality to explain this
patient's bloody nipple discharge. MRI and surgical consultation is
recommended.
2. Mild RIGHT breast duct ectasia without intraductal abnormality.
3. No mammographic evidence of breast malignancy.

RECOMMENDATION:
1. Bilateral breast MRI with and without contrast for evaluation of
bloody RIGHT nipple discharge.
2. Surgical consultation.

I have discussed the findings and recommendations with the patient.
If applicable, a reminder letter will be sent to the patient
regarding the next appointment.

BI-RADS CATEGORY  2: Benign.

## 2021-11-18 ENCOUNTER — Other Ambulatory Visit (HOSPITAL_COMMUNITY): Payer: Self-pay

## 2021-11-21 IMAGING — US US AXILLARY RIGHT
1 series · 10 of 10 positions shown · non-contrast
Comparison: Previous exam(s).

CLINICAL DATA: 54-year-old female presenting for workup of the
right axilla following recent breast MRI. Patient initially
presented with right bloody nipple discharge earlier this month. On
recent MRI there was abnormal enhancement in the right areola as
well as a possible single abnormal right axillary lymph node.
Patient is scheduled for surgical biopsy of the right breast.

EXAM:
ULTRASOUND OF THE RIGHT AXILLA

[Series 1: us axillary right · 0.07mm/px · 10 of 10 slices shown]
[im 1/10]
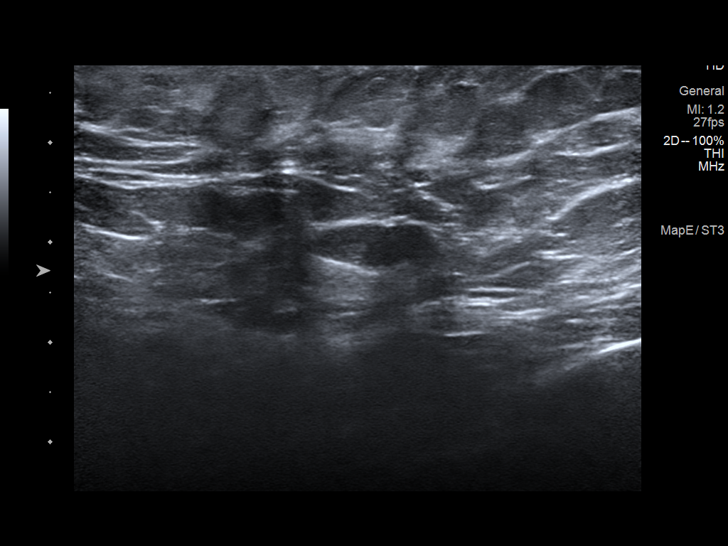
[im 2/10]
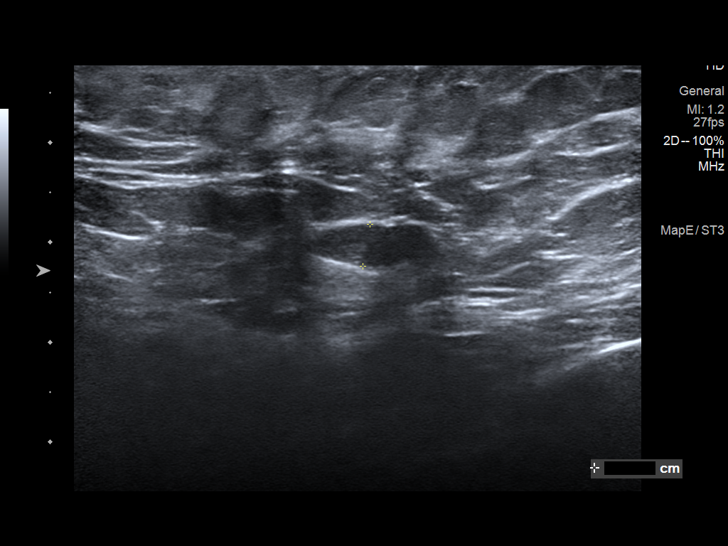
[im 3/10]
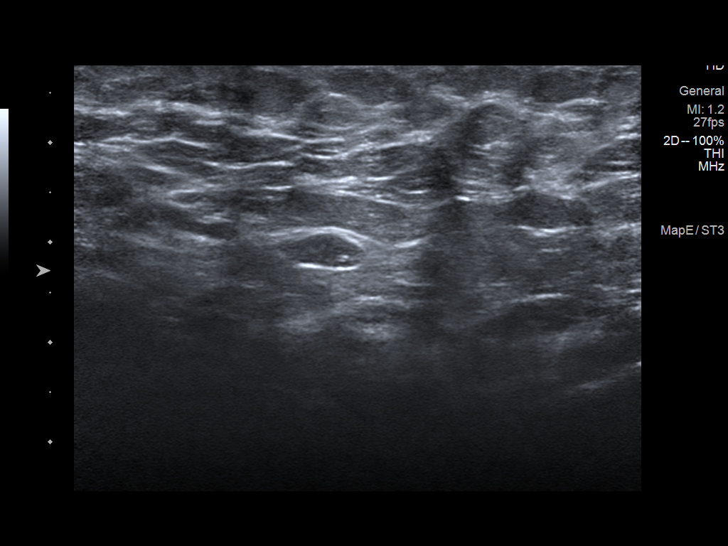
[im 4/10]
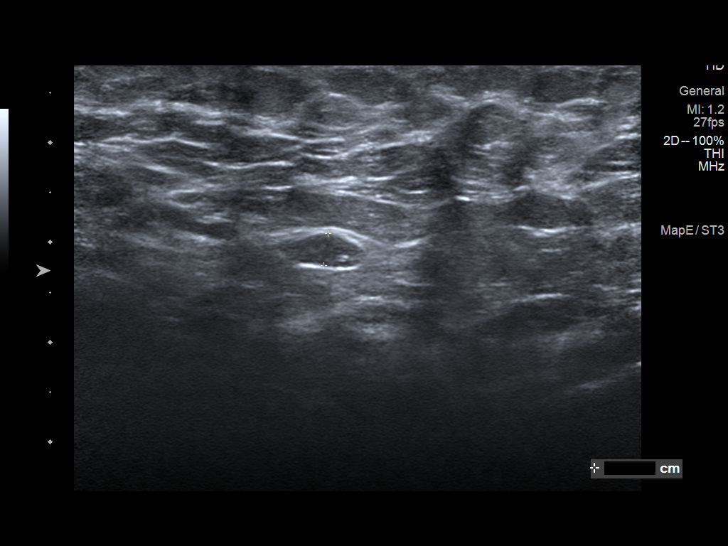
[im 5/10]
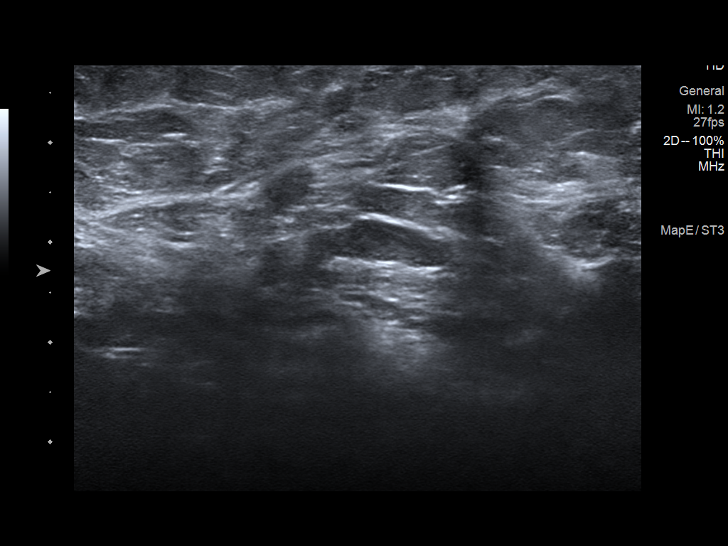
[im 6/10]
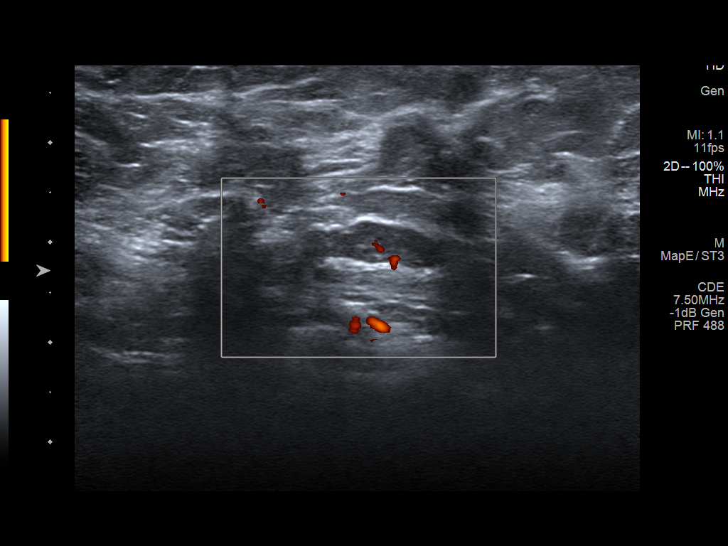
[im 7/10]
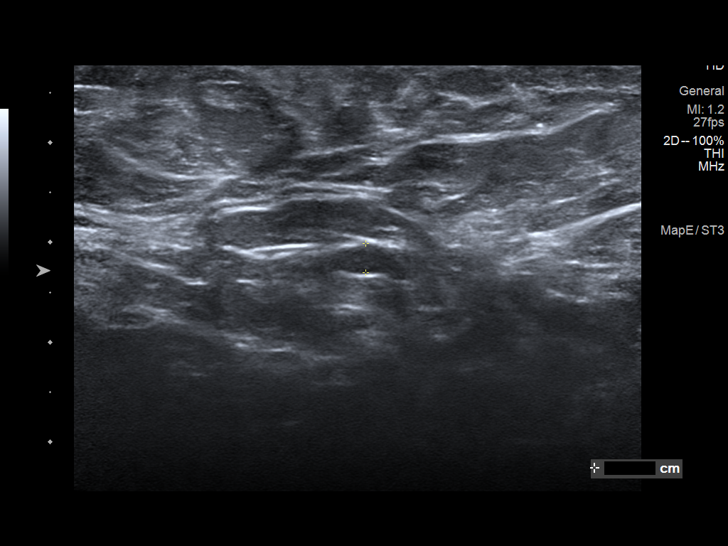
[im 8/10]
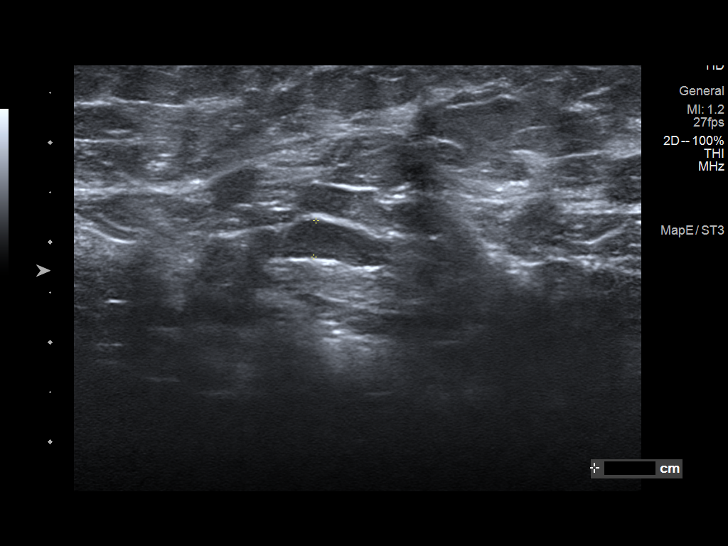
[im 9/10]
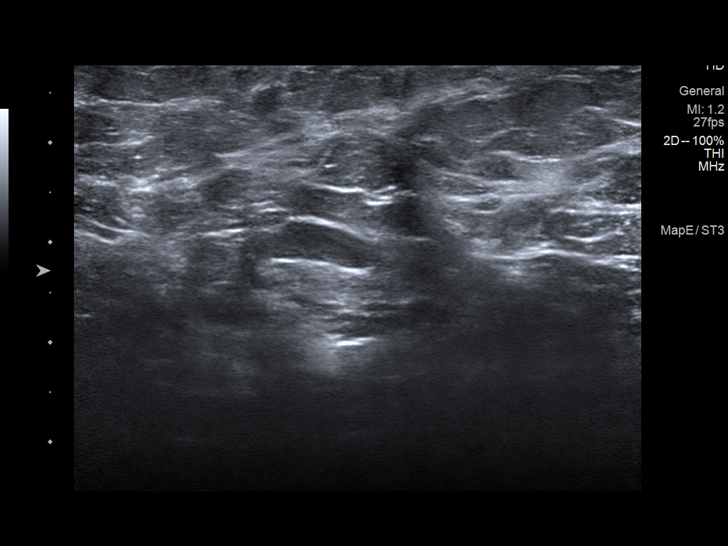
[im 10/10]
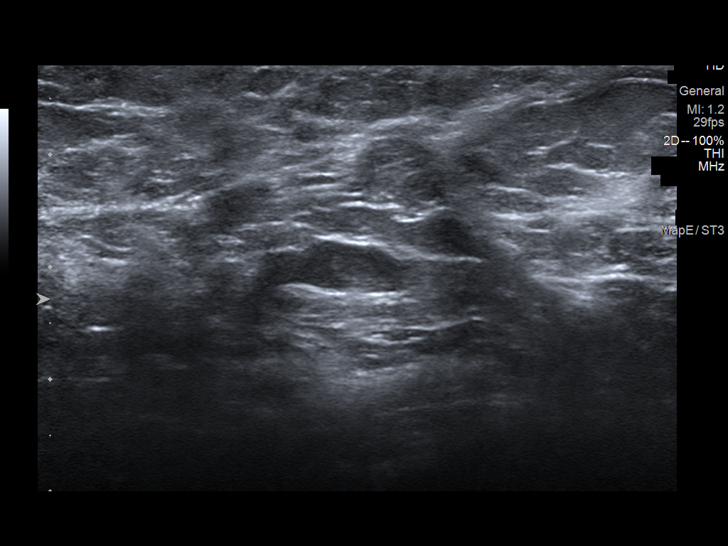

[10 of 10 positions shown; findings below may reference images not displayed]

FINDINGS: Targeted ultrasound is performed throughout the right axilla
demonstrating a single lymph node with borderline cortical
thickening measuring 0.4 cm. There is normal shape and retention of
the fatty hila. No other abnormal lymph nodes identified.
IMPRESSION: Single indeterminate right axillary lymph node with mild cortical
thickening.

RECOMMENDATION:
Ultrasound-guided core needle biopsy of the right axilla.

I have discussed the findings and recommendations with the patient.
If applicable, a reminder letter will be sent to the patient
regarding the next appointment.

BI-RADS CATEGORY  4: Suspicious.

## 2021-11-26 ENCOUNTER — Other Ambulatory Visit (HOSPITAL_COMMUNITY): Payer: Self-pay

## 2021-12-01 ENCOUNTER — Other Ambulatory Visit (HOSPITAL_COMMUNITY): Payer: Self-pay

## 2022-01-13 ENCOUNTER — Other Ambulatory Visit (HOSPITAL_COMMUNITY): Payer: Self-pay

## 2022-03-11 ENCOUNTER — Other Ambulatory Visit (HOSPITAL_COMMUNITY): Payer: Self-pay

## 2022-03-11 MED ORDER — POTASSIUM CHLORIDE CRYS ER 20 MEQ PO TBCR
20.0000 meq | EXTENDED_RELEASE_TABLET | Freq: Every day | ORAL | 0 refills | Status: DC
  Filled 2022-03-11: qty 90, 90d supply, fill #0

## 2022-03-11 MED ORDER — OLMESARTAN MEDOXOMIL-HCTZ 40-12.5 MG PO TABS
1.0000 | ORAL_TABLET | Freq: Every day | ORAL | 0 refills | Status: DC
  Filled 2022-03-11 – 2022-03-27 (×3): qty 90, 90d supply, fill #0

## 2022-03-16 ENCOUNTER — Other Ambulatory Visit (HOSPITAL_COMMUNITY): Payer: Self-pay

## 2022-03-20 ENCOUNTER — Other Ambulatory Visit (HOSPITAL_COMMUNITY): Payer: Self-pay

## 2022-03-27 ENCOUNTER — Other Ambulatory Visit (HOSPITAL_COMMUNITY): Payer: Self-pay

## 2022-04-20 ENCOUNTER — Other Ambulatory Visit (HOSPITAL_COMMUNITY): Payer: Self-pay

## 2022-04-20 DIAGNOSIS — Z13220 Encounter for screening for lipoid disorders: Secondary | ICD-10-CM | POA: Diagnosis not present

## 2022-04-20 DIAGNOSIS — R42 Dizziness and giddiness: Secondary | ICD-10-CM | POA: Diagnosis not present

## 2022-04-20 DIAGNOSIS — Z0000 Encounter for general adult medical examination without abnormal findings: Secondary | ICD-10-CM | POA: Diagnosis not present

## 2022-04-20 DIAGNOSIS — Z23 Encounter for immunization: Secondary | ICD-10-CM | POA: Diagnosis not present

## 2022-04-20 DIAGNOSIS — E876 Hypokalemia: Secondary | ICD-10-CM | POA: Diagnosis not present

## 2022-04-20 DIAGNOSIS — I10 Essential (primary) hypertension: Secondary | ICD-10-CM | POA: Diagnosis not present

## 2022-04-20 MED ORDER — ATORVASTATIN CALCIUM 20 MG PO TABS
20.0000 mg | ORAL_TABLET | Freq: Every day | ORAL | 2 refills | Status: DC
  Filled 2022-04-20: qty 30, 30d supply, fill #0
  Filled 2022-05-18: qty 30, 30d supply, fill #1
  Filled 2022-06-03 – 2022-12-31 (×4): qty 30, 30d supply, fill #2

## 2022-04-27 ENCOUNTER — Ambulatory Visit
Admission: RE | Admit: 2022-04-27 | Discharge: 2022-04-27 | Disposition: A | Discharge status: Home / Self Care | Payer: 59 | Source: Ambulatory Visit | Attending: Urgent Care | Admitting: Urgent Care

## 2022-04-27 ENCOUNTER — Ambulatory Visit (INDEPENDENT_AMBULATORY_CARE_PROVIDER_SITE_OTHER): Payer: 59

## 2022-04-27 ENCOUNTER — Other Ambulatory Visit (HOSPITAL_COMMUNITY): Payer: Self-pay

## 2022-04-27 VITALS — BP 168/85 | HR 97 | Temp 98.5°F | Resp 18

## 2022-04-27 DIAGNOSIS — J988 Other specified respiratory disorders: Secondary | ICD-10-CM | POA: Diagnosis not present

## 2022-04-27 DIAGNOSIS — R079 Chest pain, unspecified: Secondary | ICD-10-CM | POA: Diagnosis not present

## 2022-04-27 DIAGNOSIS — B9789 Other viral agents as the cause of diseases classified elsewhere: Secondary | ICD-10-CM | POA: Diagnosis not present

## 2022-04-27 DIAGNOSIS — R051 Acute cough: Secondary | ICD-10-CM | POA: Diagnosis not present

## 2022-04-27 DIAGNOSIS — I10 Essential (primary) hypertension: Secondary | ICD-10-CM | POA: Diagnosis not present

## 2022-04-27 DIAGNOSIS — R059 Cough, unspecified: Secondary | ICD-10-CM

## 2022-04-27 DIAGNOSIS — R0789 Other chest pain: Secondary | ICD-10-CM

## 2022-04-27 DIAGNOSIS — Z20822 Contact with and (suspected) exposure to covid-19: Secondary | ICD-10-CM | POA: Diagnosis not present

## 2022-04-27 LAB — RESP PANEL BY RT-PCR (FLU A&B, COVID) ARPGX2
Influenza A by PCR: NEGATIVE
Influenza B by PCR: NEGATIVE
SARS Coronavirus 2 by RT PCR: NEGATIVE

## 2022-04-27 MED ORDER — IPRATROPIUM BROMIDE 0.03 % NA SOLN
2.0000 | Freq: Two times a day (BID) | NASAL | 0 refills | Status: AC
  Filled 2022-04-27: qty 30, 43d supply, fill #0

## 2022-04-27 MED ORDER — CETIRIZINE HCL 10 MG PO TABS
10.0000 mg | ORAL_TABLET | Freq: Every day | ORAL | 0 refills | Status: AC
  Filled 2022-04-27: qty 30, 30d supply, fill #0

## 2022-04-27 MED ORDER — PROMETHAZINE-DM 6.25-15 MG/5ML PO SYRP
2.5000 mL | ORAL_SOLUTION | Freq: Three times a day (TID) | ORAL | 0 refills | Status: AC | PRN
  Filled 2022-04-27: qty 100, 14d supply, fill #0

## 2022-04-27 NOTE — ED Provider Notes (Signed)
[place]   MRN: [id number] DOB: [birth year]  Subjective:   Victoria Allen is a 56 y.o. female presenting for 3-day history of acute onset sinus congestion, body aches, coughing, right-sided chest pain that radiates to the back.  Has a history of allergic rhinitis but not asthma.  Also has a history of essential hypertension.  Would like to be checked for COVID.  Wants to make sure she does not have pneumonia.  She is okay with getting blood work, should she test positive for COVID would be a good candidate for Paxlovid.  No current facility-administered medications for this encounter.  Current Outpatient Medications:    atorvastatin (LIPITOR) 20 MG tablet, Take 1 tablet (20 mg total) by mouth daily., Disp: 30 tablet, Rfl: 2   fexofenadine (ALLEGRA) 180 MG tablet, Take 180 mg by mouth daily., Disp: , Rfl:    hydrocortisone 2.5 % ointment, Apply 1 application to flared areas of the skin 1-2 times daily as needed., Disp: 454 g, Rfl: 1   meclizine (ANTIVERT) 25 MG tablet, Take 1 tablet (25 mg total) by mouth 3 (three) times daily as needed for dizziness, Disp: 30 tablet, Rfl: 0   MECLIZINE HCL PO, Take by mouth as needed., Disp: , Rfl:    olmesartan-hydrochlorothiazide (BENICAR HCT) 40-12.5 MG tablet, Take 1 tablet by mouth daily., Disp: , Rfl:    olmesartan-hydrochlorothiazide (BENICAR HCT) 40-12.5 MG tablet, Take 1 tablet by mouth daily., Disp: 90 tablet, Rfl: 0   Potassium 75 MG TABS, Take by mouth., Disp: , Rfl:    potassium chloride SA (KLOR-CON M) 20 MEQ tablet, Take 1 tablet (20 mEq total) by mouth daily with food, Disp: 90 tablet, Rfl: 0   potassium chloride SA (KLOR-CON) 20 MEQ tablet, TAKE 1 TABLET BY MOUTH EVERY DAY WITH FOOD, Disp: 30 tablet, Rfl: 12   potassium chloride SA (KLOR-CON M) 20 MEQ tablet, Take 1 tablet (20 mEq total) by mouth daily with food, Disp: 90 tablet, Rfl: 2   Allergies  Allergen Reactions   Penicillin V Other (See Comments)    Codeine Itching   Penicillins Hives    Has patient had a PCN reaction causing immediate rash, facial/tongue/throat swelling, SOB or lightheadedness with hypotension: No Has patient had a PCN reaction causing severe rash involving mucus membranes or skin necrosis: No Has patient had a PCN reaction that required hospitalization No Has patient had a PCN reaction occurring within the last 10 years: No If all of the above answers are "NO", then may proceed with Cephalosporin use.     Past Medical History:  Diagnosis Date   Allergic rhinitis    Anemia    Dysmenorrhea    Fibroids    with dysmenorrhea   Herniated disc    Hypertension    Varicella infection      Past Surgical History:  Procedure Laterality Date   ABDOMINAL HYSTERECTOMY     ANTERIOR CERVICAL DECOMP/DISCECTOMY FUSION N/A 07/17/2015   Procedure: Anterior Cervical Decompression Fusion Cervical five-six,Cervical six-seven ;  Surgeon: Eustace Moore, MD;  Location: [facility];  Service: Neurosurgery;  Laterality: N/A;   BREAST BIOPSY Right 07/19/2020   Procedure: RIGHT BREAST NIPPLE BIOPSY;  Surgeon: Jovita Kussmaul, MD;  Location: [facility];  Service: General;  Laterality: Right;   NASAL SINUS SURGERY      Family History  Problem Relation Age of Onset   Hypertension Mother    Heart attack Father    Cancer -  Prostate Father    Hypertension Father    Diabetes Father    Hypertension Maternal Grandmother    Cancer - Colon Maternal Grandmother    Hypertension Paternal Grandmother    Heart attack Paternal Grandmother    Breast cancer Sister     Social History   Tobacco Use   Smoking status: Never   Smokeless tobacco: Never  Vaping Use   Vaping Use: Never used  Substance Use Topics   Alcohol use: Yes    Comment: occasionally   Drug use: No    ROS   Objective:   Vitals: BP (!) 168/85   Pulse 97   Temp 98.5 F (36.9 C)   Resp 18   SpO2 93%   Physical Exam Constitutional:       General: She is not in acute distress.    Appearance: Normal appearance. She is well-developed. She is not ill-appearing, toxic-appearing or diaphoretic.  HENT:     Head: Normocephalic and atraumatic.     Nose: Nose normal.     Mouth/Throat:     Mouth: Mucous membranes are moist.     Pharynx: No pharyngeal swelling, oropharyngeal exudate, posterior oropharyngeal erythema or uvula swelling.     Tonsils: No tonsillar exudate or tonsillar abscesses. 0 on the right. 0 on the left.  Eyes:     General: No scleral icterus.       Right eye: No discharge.        Left eye: No discharge.     Extraocular Movements: Extraocular movements intact.  Cardiovascular:     Rate and Rhythm: Normal rate and regular rhythm.     Heart sounds: Normal heart sounds. No murmur heard.    No friction rub. No gallop.  Pulmonary:     Effort: Pulmonary effort is normal. No respiratory distress.     Breath sounds: No stridor. No wheezing, rhonchi or rales.  Chest:     Chest wall: No tenderness.  Skin:    General: Skin is warm and dry.  Neurological:     General: No focal deficit present.     Mental Status: She is alert and oriented to person, place, and time.  Psychiatric:        Mood and Affect: Mood normal.        Behavior: Behavior normal.    DG Chest 2 View  Result Date: 04/27/2022 CLINICAL DATA:  Cough, right-sided chest pain EXAM: CHEST - 2 VIEW COMPARISON:  None Available. FINDINGS: The heart size and mediastinal contours are within normal limits. Both lungs are clear. The visualized skeletal structures are unremarkable. IMPRESSION: No active cardiopulmonary disease. Electronically Signed   By: Keane Police D.O.   On: 04/27/2022 16:12     Assessment and Plan :   PDMP not reviewed this encounter.  1. Viral respiratory illness   2. Right-sided chest pain   3. Acute cough   4. Essential hypertension    Chest x-ray negative.  Recommended supportive care.  Patient would be a good candidate for  Paxlovid should she test positive for COVID-19, labs pending for her GFR level. Will manage for viral illness such as viral URI, viral syndrome, viral rhinitis, COVID-19. Recommended supportive care. Offered scripts for symptomatic relief. Testing is pending. Counseled patient on potential for adverse effects with medications prescribed/recommended today, ER and return-to-clinic precautions discussed, patient verbalized understanding.     Jaynee Eagles, PA-C 04/27/22 1723

## 2022-04-27 NOTE — ED Triage Notes (Signed)
Pt here with cough, congestion and back aches x 3 days.

## 2022-04-27 NOTE — Discharge Instructions (Signed)
We will notify you of your test results as they arrive and may take between 24-48 hours.  I encourage you to sign up for MyChart if you have not already done so as this can be the easiest way for [place] to communicate results to you online or through a phone app.  Generally, we only contact you if it is a positive test result.  In the meantime, if you develop worsening symptoms including fever, chest pain, shortness of breath despite our current treatment plan then please report to the emergency room as this may be a sign of worsening status from possible viral infection.  Otherwise, we will manage this as a viral syndrome. For sore throat or cough try using a honey-based tea. Use 3 teaspoons of honey with juice squeezed from half lemon. Place shaved pieces of ginger into 1/2-1 cup of water and warm over stove top. Then mix the ingredients and repeat every 4 hours as needed. Please take Tylenol '500mg'$ -'650mg'$  every 6 hours for aches and pains, fevers. Hydrate very well with at least 2 liters of water. Eat light meals such as soups to replenish electrolytes and soft fruits, veggies. Start an antihistamine like Zyrtec for postnasal drainage, sinus congestion.  You can take this together with the nose spray.  Use the cough medications as needed.

## 2022-04-28 LAB — COMPREHENSIVE METABOLIC PANEL
ALT: 15 IU/L (ref 0–32)
AST: 21 IU/L (ref 0–40)
Albumin/Globulin Ratio: 1.4 (ref 1.2–2.2)
Albumin: 4.3 g/dL (ref 3.8–4.9)
Alkaline Phosphatase: 92 IU/L (ref 44–121)
BUN/Creatinine Ratio: 12 (ref 9–23)
BUN: 10 mg/dL (ref 6–24)
Bilirubin Total: <0.2 mg/dL (ref 0.0–1.2)
CO2: 26 mmol/L (ref 20–29)
Calcium: 9.3 mg/dL (ref 8.7–10.2)
Chloride: 102 mmol/L (ref 96–106)
Creatinine, Ser: 0.82 mg/dL (ref 0.57–1.00)
Globulin, Total: 3.1 g/dL (ref 1.5–4.5)
Glucose: 101 mg/dL — ABNORMAL HIGH (ref 70–99)
Potassium: 3.6 mmol/L (ref 3.5–5.2)
Sodium: 140 mmol/L (ref 134–144)
Total Protein: 7.4 g/dL (ref 6.0–8.5)
eGFR: 84 mL/min/{1.73_m2} (ref >59)

## 2022-05-18 ENCOUNTER — Other Ambulatory Visit (HOSPITAL_COMMUNITY): Payer: Self-pay

## 2022-05-21 ENCOUNTER — Other Ambulatory Visit: Payer: Self-pay | Admitting: Family Medicine

## 2022-05-21 DIAGNOSIS — Z1231 Encounter for screening mammogram for malignant neoplasm of breast: Secondary | ICD-10-CM

## 2022-06-02 ENCOUNTER — Other Ambulatory Visit (HOSPITAL_COMMUNITY): Payer: Self-pay

## 2022-06-03 ENCOUNTER — Other Ambulatory Visit (HOSPITAL_COMMUNITY): Payer: Self-pay

## 2022-06-03 MED ORDER — POTASSIUM CHLORIDE CRYS ER 20 MEQ PO TBCR
20.0000 meq | EXTENDED_RELEASE_TABLET | Freq: Every day | ORAL | 2 refills | Status: DC
  Filled 2022-06-03: qty 90, 90d supply, fill #0
  Filled 2022-07-02: qty 30, 30d supply, fill #0
  Filled 2022-08-19: qty 30, 30d supply, fill #1
  Filled 2022-09-23: qty 30, 30d supply, fill #2

## 2022-06-04 ENCOUNTER — Other Ambulatory Visit (HOSPITAL_COMMUNITY): Payer: Self-pay

## 2022-06-12 ENCOUNTER — Other Ambulatory Visit (HOSPITAL_COMMUNITY): Payer: Self-pay

## 2022-06-16 ENCOUNTER — Other Ambulatory Visit (HOSPITAL_COMMUNITY): Payer: Self-pay

## 2022-06-22 ENCOUNTER — Other Ambulatory Visit (HOSPITAL_COMMUNITY): Payer: Self-pay

## 2022-06-22 DIAGNOSIS — Z13220 Encounter for screening for lipoid disorders: Secondary | ICD-10-CM | POA: Diagnosis not present

## 2022-06-22 MED ORDER — ATORVASTATIN CALCIUM 20 MG PO TABS
20.0000 mg | ORAL_TABLET | Freq: Every day | ORAL | 3 refills | Status: AC
  Filled 2022-06-22: qty 90, 90d supply, fill #0
  Filled 2022-09-23: qty 90, 90d supply, fill #1
  Filled 2022-10-29: qty 90, 90d supply, fill #2

## 2022-06-24 ENCOUNTER — Other Ambulatory Visit (HOSPITAL_COMMUNITY): Payer: Self-pay

## 2022-07-02 ENCOUNTER — Other Ambulatory Visit (HOSPITAL_COMMUNITY): Payer: Self-pay

## 2022-07-06 ENCOUNTER — Ambulatory Visit
Admission: RE | Admit: 2022-07-06 | Discharge: 2022-07-06 | Disposition: A | Discharge status: Home / Self Care | Payer: 59 | Source: Ambulatory Visit | Attending: Family Medicine | Admitting: Family Medicine

## 2022-07-06 DIAGNOSIS — Z1231 Encounter for screening mammogram for malignant neoplasm of breast: Secondary | ICD-10-CM

## 2022-07-29 ENCOUNTER — Other Ambulatory Visit (HOSPITAL_COMMUNITY): Payer: Self-pay

## 2022-07-31 ENCOUNTER — Other Ambulatory Visit (HOSPITAL_COMMUNITY): Payer: Self-pay

## 2022-07-31 MED ORDER — OLMESARTAN MEDOXOMIL-HCTZ 40-12.5 MG PO TABS
1.0000 | ORAL_TABLET | Freq: Every day | ORAL | 2 refills | Status: AC
  Filled 2022-07-31: qty 90, 90d supply, fill #0
  Filled 2022-09-23 – 2022-10-29 (×2): qty 90, 90d supply, fill #1

## 2022-09-23 ENCOUNTER — Other Ambulatory Visit: Payer: Self-pay

## 2022-10-29 ENCOUNTER — Other Ambulatory Visit: Payer: Self-pay

## 2022-10-29 ENCOUNTER — Other Ambulatory Visit (HOSPITAL_COMMUNITY): Payer: Self-pay

## 2022-11-04 ENCOUNTER — Other Ambulatory Visit (HOSPITAL_COMMUNITY): Payer: Self-pay

## 2022-11-05 ENCOUNTER — Other Ambulatory Visit (HOSPITAL_COMMUNITY): Payer: Self-pay

## 2022-11-09 ENCOUNTER — Other Ambulatory Visit (HOSPITAL_COMMUNITY): Payer: Self-pay

## 2022-11-09 MED ORDER — POTASSIUM CHLORIDE CRYS ER 20 MEQ PO TBCR
20.0000 meq | EXTENDED_RELEASE_TABLET | Freq: Every day | ORAL | 3 refills | Status: DC
  Filled 2022-11-09: qty 30, 30d supply, fill #0
  Filled 2022-12-07: qty 30, 30d supply, fill #1
  Filled 2023-01-22: qty 30, 30d supply, fill #2
  Filled 2023-03-09: qty 30, 30d supply, fill #3

## 2022-12-07 ENCOUNTER — Other Ambulatory Visit (HOSPITAL_COMMUNITY): Payer: Self-pay

## 2022-12-23 ENCOUNTER — Other Ambulatory Visit (HOSPITAL_COMMUNITY): Payer: Self-pay

## 2022-12-25 ENCOUNTER — Other Ambulatory Visit (HOSPITAL_COMMUNITY): Payer: Self-pay

## 2022-12-25 MED ORDER — MECLIZINE HCL 25 MG PO TABS
25.0000 mg | ORAL_TABLET | Freq: Three times a day (TID) | ORAL | 0 refills | Status: AC
  Filled 2022-12-25: qty 30, 10d supply, fill #0

## 2023-01-01 ENCOUNTER — Other Ambulatory Visit (HOSPITAL_COMMUNITY): Payer: Self-pay

## 2023-01-26 ENCOUNTER — Other Ambulatory Visit (HOSPITAL_COMMUNITY): Payer: Self-pay

## 2023-02-01 ENCOUNTER — Other Ambulatory Visit (HOSPITAL_COMMUNITY): Payer: Self-pay

## 2023-02-03 ENCOUNTER — Other Ambulatory Visit (HOSPITAL_COMMUNITY): Payer: Self-pay

## 2023-02-03 MED ORDER — ATORVASTATIN CALCIUM 20 MG PO TABS
20.0000 mg | ORAL_TABLET | Freq: Every day | ORAL | 0 refills | Status: DC
  Filled 2023-02-03: qty 90, 90d supply, fill #0

## 2023-02-08 ENCOUNTER — Other Ambulatory Visit (HOSPITAL_COMMUNITY): Payer: Self-pay

## 2023-02-08 MED ORDER — OLMESARTAN MEDOXOMIL-HCTZ 40-12.5 MG PO TABS
1.0000 | ORAL_TABLET | Freq: Every day | ORAL | 1 refills | Status: AC
  Filled 2023-02-08: qty 90, 90d supply, fill #0

## 2023-03-05 ENCOUNTER — Other Ambulatory Visit (HOSPITAL_COMMUNITY): Payer: Self-pay

## 2023-03-05 DIAGNOSIS — J069 Acute upper respiratory infection, unspecified: Secondary | ICD-10-CM | POA: Diagnosis not present

## 2023-03-05 DIAGNOSIS — Z03818 Encounter for observation for suspected exposure to other biological agents ruled out: Secondary | ICD-10-CM | POA: Diagnosis not present

## 2023-03-05 DIAGNOSIS — J029 Acute pharyngitis, unspecified: Secondary | ICD-10-CM | POA: Diagnosis not present

## 2023-03-05 MED ORDER — AZITHROMYCIN 250 MG PO TABS
ORAL_TABLET | ORAL | 0 refills | Status: AC
  Filled 2023-03-05: qty 6, 5d supply, fill #0

## 2023-03-10 ENCOUNTER — Encounter (HOSPITAL_BASED_OUTPATIENT_CLINIC_OR_DEPARTMENT_OTHER): Payer: Self-pay | Admitting: Emergency Medicine

## 2023-03-10 ENCOUNTER — Other Ambulatory Visit: Payer: Self-pay

## 2023-03-10 ENCOUNTER — Emergency Department (HOSPITAL_BASED_OUTPATIENT_CLINIC_OR_DEPARTMENT_OTHER)
Admission: EM | Admit: 2023-03-10 | Discharge: 2023-03-11 | Disposition: A | Discharge status: Home / Self Care | Payer: Commercial Managed Care - PPO | Source: Home / Self Care | Attending: Emergency Medicine | Admitting: Emergency Medicine

## 2023-03-10 ENCOUNTER — Emergency Department (HOSPITAL_BASED_OUTPATIENT_CLINIC_OR_DEPARTMENT_OTHER): Payer: Commercial Managed Care - PPO | Admitting: Radiology

## 2023-03-10 DIAGNOSIS — R739 Hyperglycemia, unspecified: Secondary | ICD-10-CM | POA: Diagnosis not present

## 2023-03-10 DIAGNOSIS — R079 Chest pain, unspecified: Secondary | ICD-10-CM

## 2023-03-10 DIAGNOSIS — Z79899 Other long term (current) drug therapy: Secondary | ICD-10-CM | POA: Diagnosis not present

## 2023-03-10 DIAGNOSIS — I10 Essential (primary) hypertension: Secondary | ICD-10-CM | POA: Diagnosis not present

## 2023-03-10 DIAGNOSIS — R0789 Other chest pain: Secondary | ICD-10-CM | POA: Diagnosis not present

## 2023-03-10 DIAGNOSIS — R072 Precordial pain: Secondary | ICD-10-CM | POA: Insufficient documentation

## 2023-03-10 DIAGNOSIS — E876 Hypokalemia: Secondary | ICD-10-CM | POA: Diagnosis not present

## 2023-03-10 LAB — CBC
HCT: 44.1 % (ref 36.0–46.0)
Hemoglobin: 14.2 g/dL (ref 12.0–15.0)
MCH: 27 pg (ref 26.0–34.0)
MCHC: 32.2 g/dL (ref 30.0–36.0)
MCV: 84 fL (ref 80.0–100.0)
Platelets: 210 10*3/uL (ref 150–400)
RBC: 5.25 MIL/uL — ABNORMAL HIGH (ref 3.87–5.11)
RDW: 13.2 % (ref 11.5–15.5)
WBC: 8.4 10*3/uL (ref 4.0–10.5)
nRBC: 0 % (ref 0.0–0.2)

## 2023-03-10 LAB — BASIC METABOLIC PANEL
Anion gap: 9 (ref 5–15)
BUN: 15 mg/dL (ref 6–20)
CO2: 29 mmol/L (ref 22–32)
Calcium: 9.6 mg/dL (ref 8.9–10.3)
Chloride: 102 mmol/L (ref 98–111)
Creatinine, Ser: 0.94 mg/dL (ref 0.44–1.00)
GFR, Estimated: >60 mL/min (ref >60)
Glucose, Bld: 218 mg/dL — ABNORMAL HIGH (ref 70–99)
Potassium: 3 mmol/L — ABNORMAL LOW (ref 3.5–5.1)
Sodium: 140 mmol/L (ref 135–145)

## 2023-03-10 LAB — TROPONIN I (HIGH SENSITIVITY): Troponin I (High Sensitivity): 3 ng/L (ref <18)

## 2023-03-10 NOTE — ED Provider Notes (Signed)
[place] EMERGENCY DEPARTMENT AT [facility] Provider Note  CSN: [id number] Arrival date & time: 03/10/23 2124  Chief Complaint(s) Chest Pain  HPI Victoria Allen is a 57 y.o. female {Add pertinent medical, surgical, social history, OB history to HPI:1}   The history is provided by the patient.  Chest Pain Pain location:  Substernal area Duration:  1 week (at least once a day. lasting for 5 -10 mins) Timing:  Intermittent Relieved by: sitting up or being upright. Exacerbated by: mostly while lying down, but can occur while upright. Associated symptoms: shortness of breath (DOE while walking up steps)   Associated symptoms: no back pain, no cough, no fatigue, no fever, no nausea and no vomiting   Risk factors: high cholesterol and hypertension   Risk factors: no birth control, no coronary artery disease, no diabetes mellitus, no immobilization, not female, not obese, no prior DVT/PE and no smoking    Pain this evening was around 9p. Lasted 5-10 min. Has been CP free since.  Finished zpak a few days ago for "viral pharyngitis"  Stress test 10 yrs ago was negative. Never had a cath. Last saw cardiology at that time.  Past Medical History Past Medical History:  Diagnosis Date   Allergic rhinitis    Anemia    Dysmenorrhea    Fibroids    with dysmenorrhea   Herniated disc    Hypertension    Varicella infection    Patient Active Problem List   Diagnosis Date Noted   Essential hypertension 12/28/2016   Abnormal ECG 12/28/2016   S/P cervical spinal fusion 07/17/2015   Home Medication(s) Prior to Admission medications   Medication Sig Start Date End Date Taking? Authorizing Provider  atorvastatin (LIPITOR) 20 MG tablet Take 1 tablet (20 mg total) by mouth daily. 06/22/22     atorvastatin (LIPITOR) 20 MG tablet Take 1 tablet (20 mg total) by mouth daily. 02/03/23     azithromycin (ZITHROMAX Z-PAK) 250 MG tablet Take 2 tablets (500 mg total) by mouth daily for 1  day, THEN 1 tablet (250 mg total) daily for 4 days. 03/05/23 03/10/23    cetirizine (ZYRTEC ALLERGY) 10 MG tablet Take 1 tablet (10 mg total) by mouth daily. 04/27/22   Wallis Bamberg, PA-C  fexofenadine (ALLEGRA) 180 MG tablet Take 180 mg by mouth daily.    [provider]  hydrocortisone 2.5 % ointment Apply 1 application to flared areas of the skin 1-2 times daily as needed. 12/09/20     ipratropium (ATROVENT) 0.03 % nasal spray Place 2 sprays into both nostrils 2 (two) times daily. 04/27/22   Wallis Bamberg, PA-C  meclizine (ANTIVERT) 25 MG tablet Take 1 tablet (25 mg total) by mouth 3 (three) times daily as needed for dizziness 07/22/21     meclizine (ANTIVERT) 25 MG tablet Take 1 tablet (25 mg total) by mouth 3 (three) times daily as needed for dizziness 12/25/22     MECLIZINE HCL PO Take by mouth as needed.    [provider]  olmesartan-hydrochlorothiazide (BENICAR HCT) 40-12.5 MG tablet Take 1 tablet by mouth daily.    [provider]  olmesartan-hydrochlorothiazide (BENICAR HCT) 40-12.5 MG tablet Take 1 tablet by mouth daily. 07/31/22     olmesartan-hydrochlorothiazide (BENICAR HCT) 40-12.5 MG tablet Take 1 tablet by mouth daily. 02/08/23     Potassium 75 MG TABS Take by mouth.    [provider]  potassium chloride SA (KLOR-CON M) 20 MEQ tablet Take 1 tablet (20 mEq  total) by mouth daily with food. 11/08/22     potassium chloride SA (KLOR-CON) 20 MEQ tablet TAKE 1 TABLET BY MOUTH EVERY DAY WITH FOOD 04/11/20 04/23/21  Deatra James, MD  potassium chloride SA (KLOR-CON M) 20 MEQ tablet Take 1 tablet (20 mEq total) by mouth daily with food 03/26/21     promethazine-dextromethorphan (PROMETHAZINE-DM) 6.25-15 MG/5ML syrup Take 2.5 mLs by mouth 3 (three) times daily as needed for cough. 04/27/22   Wallis Bamberg, PA-C                                                                                                                                    Allergies Penicillin v, Codeine,  and Penicillins  Review of Systems Review of Systems  Constitutional:  Negative for fatigue and fever.  Respiratory:  Positive for shortness of breath (DOE while walking up steps). Negative for cough.   Cardiovascular:  Positive for chest pain.  Gastrointestinal:  Negative for nausea and vomiting.  Musculoskeletal:  Negative for back pain.   As noted in HPI  Physical Exam Vital Signs  I have reviewed the triage vital signs BP (!) 166/103   Pulse 78   Temp 98.3 F (36.8 C)   Resp 15   Ht 5\' 2"  (1.575 m)   Wt 70.3 kg   SpO2 100%   BMI 28.35 kg/m   Physical Exam Vitals reviewed.  Constitutional:      General: She is not in acute distress.    Appearance: She is well-developed. She is not diaphoretic.  HENT:     Head: Normocephalic and atraumatic.     Nose: Nose normal.  Eyes:     General: No scleral icterus.       Right eye: No discharge.        Left eye: No discharge.     Conjunctiva/sclera: Conjunctivae normal.     Pupils: Pupils are equal, round, and reactive to light.  Cardiovascular:     Rate and Rhythm: Normal rate and regular rhythm.     Heart sounds: No murmur heard.    No friction rub. No gallop.  Pulmonary:     Effort: Pulmonary effort is normal. No respiratory distress.     Breath sounds: Normal breath sounds. No stridor. No rales.  Chest:     Chest wall: No tenderness.  Abdominal:     General: There is no distension.     Palpations: Abdomen is soft.     Tenderness: There is no abdominal tenderness.  Musculoskeletal:        General: No tenderness.     Cervical back: Normal range of motion and neck supple.  Skin:    General: Skin is warm and dry.     Findings: No erythema or rash.  Neurological:     Mental Status: She is alert and oriented to person, place, and time.     ED Results and Treatments Labs (  all labs ordered are listed, but only abnormal results are displayed) Labs Reviewed  BASIC METABOLIC PANEL - Abnormal; Notable for the  following components:      Result Value   Potassium 3.0 (*)    Glucose, Bld 218 (*)    All other components within normal limits  CBC - Abnormal; Notable for the following components:   RBC 5.25 (*)    All other components within normal limits  TROPONIN I (HIGH SENSITIVITY)  TROPONIN I (HIGH SENSITIVITY)                                                                                                                         EKG  EKG Interpretation Date/Time:  Wednesday March 10 2023 21:33:23 EDT Ventricular Rate:  74 PR Interval:  122 QRS Duration:  76 QT Interval:  384 QTC Calculation: 426 R Axis:   66  Text Interpretation: Normal sinus rhythm Cannot rule out Anterior infarct , age undetermined ST & T wave abnormality, consider inferolateral ischemia Abnormal ECG When compared with ECG of 17-Jul-2020 14:10, Minimal criteria for Anterior infarct are now Present agree, similar to previous tracing with slight increase in depth of t wave inversions Confirmed by Arby Barrette [id number]) on 03/10/2023 9:37:32 PM       Radiology DG Chest 2 View  Result Date: 03/10/2023 CLINICAL DATA:  Chest pain. EXAM: CHEST - 2 VIEW COMPARISON:  04/27/2022 FINDINGS: The cardiomediastinal contours are normal. The lungs are clear. Pulmonary vasculature is normal. No consolidation, pleural effusion, or pneumothorax. No acute osseous abnormalities are seen. Overlying telemetry leads. IMPRESSION: No acute chest findings. Electronically Signed   By: Narda Rutherford M.D.   On: 03/10/2023 22:04    Medications Ordered in ED Medications - No data to display Procedures Procedures  (including critical care time) Medical Decision Making / ED Course   Medical Decision Making Amount and/or Complexity of Data Reviewed Labs: ordered. Radiology: ordered.    ***    Final Clinical Impression(s) / ED Diagnoses Final diagnoses:  None    This chart was dictated using voice recognition software.  Despite best  efforts to proofread,  errors can occur which can change the documentation meaning.

## 2023-03-10 NOTE — ED Triage Notes (Signed)
Sharp pain mid chest, tingling on left side chest x 1 week, worse today.  Denies n/v, no sob

## 2023-03-11 LAB — TROPONIN I (HIGH SENSITIVITY): Troponin I (High Sensitivity): 3 ng/L (ref <18)

## 2023-03-11 MED ORDER — POTASSIUM CHLORIDE CRYS ER 20 MEQ PO TBCR
40.0000 meq | EXTENDED_RELEASE_TABLET | Freq: Once | ORAL | Status: AC
  Administered 2023-03-11: 40 meq via ORAL
  Filled 2023-03-11: qty 2

## 2023-03-15 ENCOUNTER — Other Ambulatory Visit (HOSPITAL_COMMUNITY): Payer: Self-pay | Admitting: Family Medicine

## 2023-03-15 DIAGNOSIS — E785 Hyperlipidemia, unspecified: Secondary | ICD-10-CM

## 2023-03-15 DIAGNOSIS — E876 Hypokalemia: Secondary | ICD-10-CM | POA: Diagnosis not present

## 2023-03-15 DIAGNOSIS — I10 Essential (primary) hypertension: Secondary | ICD-10-CM | POA: Diagnosis not present

## 2023-03-15 DIAGNOSIS — R079 Chest pain, unspecified: Secondary | ICD-10-CM | POA: Diagnosis not present

## 2023-03-29 ENCOUNTER — Ambulatory Visit (HOSPITAL_COMMUNITY)
Admission: RE | Admit: 2023-03-29 | Discharge: 2023-03-29 | Disposition: A | Discharge status: Home / Self Care | Payer: Self-pay | Source: Ambulatory Visit | Attending: Family Medicine | Admitting: Family Medicine

## 2023-03-29 ENCOUNTER — Encounter (HOSPITAL_COMMUNITY): Payer: Self-pay

## 2023-03-29 DIAGNOSIS — R079 Chest pain, unspecified: Secondary | ICD-10-CM | POA: Insufficient documentation

## 2023-03-29 DIAGNOSIS — E785 Hyperlipidemia, unspecified: Secondary | ICD-10-CM | POA: Insufficient documentation

## 2023-03-29 DIAGNOSIS — I10 Essential (primary) hypertension: Secondary | ICD-10-CM | POA: Insufficient documentation

## 2023-04-14 ENCOUNTER — Other Ambulatory Visit (HOSPITAL_COMMUNITY): Payer: Self-pay

## 2023-04-14 MED ORDER — POTASSIUM CHLORIDE CRYS ER 20 MEQ PO TBCR
20.0000 meq | EXTENDED_RELEASE_TABLET | Freq: Every day | ORAL | 0 refills | Status: DC
  Filled 2023-04-14: qty 30, 30d supply, fill #0

## 2023-05-11 ENCOUNTER — Other Ambulatory Visit (HOSPITAL_COMMUNITY): Payer: Self-pay

## 2023-05-11 DIAGNOSIS — E785 Hyperlipidemia, unspecified: Secondary | ICD-10-CM | POA: Diagnosis not present

## 2023-05-11 DIAGNOSIS — E876 Hypokalemia: Secondary | ICD-10-CM | POA: Diagnosis not present

## 2023-05-11 DIAGNOSIS — R42 Dizziness and giddiness: Secondary | ICD-10-CM | POA: Diagnosis not present

## 2023-05-11 DIAGNOSIS — I10 Essential (primary) hypertension: Secondary | ICD-10-CM | POA: Diagnosis not present

## 2023-05-11 DIAGNOSIS — Z0000 Encounter for general adult medical examination without abnormal findings: Secondary | ICD-10-CM | POA: Diagnosis not present

## 2023-05-11 MED ORDER — AMLODIPINE BESYLATE 5 MG PO TABS
5.0000 mg | ORAL_TABLET | Freq: Every day | ORAL | 1 refills | Status: AC
  Filled 2023-05-11: qty 90, 90d supply, fill #0
  Filled 2023-08-05: qty 90, 90d supply, fill #1

## 2023-05-11 MED ORDER — POTASSIUM CHLORIDE CRYS ER 20 MEQ PO TBCR
20.0000 meq | EXTENDED_RELEASE_TABLET | Freq: Every day | ORAL | 3 refills | Status: DC
  Filled 2023-05-11: qty 90, 90d supply, fill #0
  Filled 2023-08-13: qty 90, 90d supply, fill #1
  Filled 2023-11-25: qty 90, 90d supply, fill #2
  Filled 2024-02-29: qty 90, 90d supply, fill #3

## 2023-05-11 MED ORDER — OLMESARTAN MEDOXOMIL-HCTZ 40-12.5 MG PO TABS
1.0000 | ORAL_TABLET | Freq: Every day | ORAL | 1 refills | Status: DC
  Filled 2023-05-11: qty 90, 90d supply, fill #0
  Filled 2023-08-04: qty 90, 90d supply, fill #1

## 2023-05-11 MED ORDER — ATORVASTATIN CALCIUM 20 MG PO TABS
20.0000 mg | ORAL_TABLET | Freq: Every day | ORAL | 1 refills | Status: AC
  Filled 2023-05-11: qty 90, 90d supply, fill #0
  Filled 2023-08-04: qty 90, 90d supply, fill #1

## 2023-05-12 ENCOUNTER — Other Ambulatory Visit (HOSPITAL_COMMUNITY): Payer: Self-pay

## 2023-05-24 ENCOUNTER — Other Ambulatory Visit: Payer: Self-pay | Admitting: Family Medicine

## 2023-05-24 DIAGNOSIS — Z1231 Encounter for screening mammogram for malignant neoplasm of breast: Secondary | ICD-10-CM

## 2023-07-09 ENCOUNTER — Ambulatory Visit
Admission: RE | Admit: 2023-07-09 | Discharge: 2023-07-09 | Disposition: A | Discharge status: Home / Self Care | Payer: Commercial Managed Care - PPO | Source: Ambulatory Visit | Attending: Family Medicine | Admitting: Family Medicine

## 2023-07-09 DIAGNOSIS — Z1231 Encounter for screening mammogram for malignant neoplasm of breast: Secondary | ICD-10-CM | POA: Diagnosis not present

## 2023-08-03 ENCOUNTER — Other Ambulatory Visit (HOSPITAL_COMMUNITY): Payer: Self-pay

## 2023-08-04 ENCOUNTER — Other Ambulatory Visit: Payer: Self-pay

## 2023-08-04 ENCOUNTER — Other Ambulatory Visit (HOSPITAL_COMMUNITY): Payer: Self-pay

## 2023-08-04 MED ORDER — ATORVASTATIN CALCIUM 20 MG PO TABS
20.0000 mg | ORAL_TABLET | Freq: Every day | ORAL | 0 refills | Status: DC
  Filled 2023-08-04 – 2023-11-12 (×2): qty 90, 90d supply, fill #0

## 2023-08-13 ENCOUNTER — Other Ambulatory Visit (HOSPITAL_COMMUNITY): Payer: Self-pay

## 2023-11-12 ENCOUNTER — Other Ambulatory Visit (HOSPITAL_COMMUNITY): Payer: Self-pay

## 2023-11-12 MED ORDER — OLMESARTAN MEDOXOMIL-HCTZ 40-12.5 MG PO TABS
1.0000 | ORAL_TABLET | Freq: Every day | ORAL | 0 refills | Status: AC
  Filled 2023-11-12: qty 90, 90d supply, fill #0

## 2023-11-26 ENCOUNTER — Other Ambulatory Visit (HOSPITAL_COMMUNITY): Payer: Self-pay

## 2023-11-26 DIAGNOSIS — I10 Essential (primary) hypertension: Secondary | ICD-10-CM | POA: Diagnosis not present

## 2023-11-26 MED ORDER — OLMESARTAN MEDOXOMIL-HCTZ 40-25 MG PO TABS
1.0000 | ORAL_TABLET | Freq: Every day | ORAL | 1 refills | Status: DC
Start: 2023-11-26 — End: 2024-05-24
  Filled 2023-11-26: qty 30, 30d supply, fill #0
  Filled 2023-11-26: qty 60, 60d supply, fill #0
  Filled 2024-02-23: qty 90, 90d supply, fill #1

## 2024-02-01 ENCOUNTER — Other Ambulatory Visit (HOSPITAL_COMMUNITY): Payer: Self-pay

## 2024-02-08 ENCOUNTER — Other Ambulatory Visit (HOSPITAL_COMMUNITY): Payer: Self-pay

## 2024-02-09 ENCOUNTER — Other Ambulatory Visit (HOSPITAL_COMMUNITY): Payer: Self-pay

## 2024-02-11 ENCOUNTER — Other Ambulatory Visit (HOSPITAL_COMMUNITY): Payer: Self-pay

## 2024-02-11 MED ORDER — ATORVASTATIN CALCIUM 20 MG PO TABS
20.0000 mg | ORAL_TABLET | Freq: Every day | ORAL | 0 refills | Status: DC
  Filled 2024-02-11: qty 90, 90d supply, fill #0

## 2024-02-14 ENCOUNTER — Other Ambulatory Visit: Payer: Self-pay | Admitting: Family Medicine

## 2024-02-14 DIAGNOSIS — Z1231 Encounter for screening mammogram for malignant neoplasm of breast: Secondary | ICD-10-CM

## 2024-02-24 ENCOUNTER — Other Ambulatory Visit (HOSPITAL_COMMUNITY): Payer: Self-pay

## 2024-03-08 DIAGNOSIS — N6310 Unspecified lump in the right breast, unspecified quadrant: Secondary | ICD-10-CM | POA: Diagnosis not present

## 2024-03-13 ENCOUNTER — Other Ambulatory Visit: Payer: Self-pay | Admitting: Family Medicine

## 2024-03-13 DIAGNOSIS — N6310 Unspecified lump in the right breast, unspecified quadrant: Secondary | ICD-10-CM

## 2024-03-17 ENCOUNTER — Ambulatory Visit
Admission: RE | Admit: 2024-03-17 | Discharge: 2024-03-17 | Disposition: A | Discharge status: Home / Self Care | Source: Ambulatory Visit | Attending: Family Medicine | Admitting: Family Medicine

## 2024-03-17 DIAGNOSIS — N6311 Unspecified lump in the right breast, upper outer quadrant: Secondary | ICD-10-CM | POA: Diagnosis not present

## 2024-03-17 DIAGNOSIS — N6310 Unspecified lump in the right breast, unspecified quadrant: Secondary | ICD-10-CM

## 2024-03-20 ENCOUNTER — Encounter

## 2024-03-23 ENCOUNTER — Other Ambulatory Visit (HOSPITAL_COMMUNITY): Payer: Self-pay

## 2024-03-23 DIAGNOSIS — D1801 Hemangioma of skin and subcutaneous tissue: Secondary | ICD-10-CM | POA: Diagnosis not present

## 2024-03-23 DIAGNOSIS — L821 Other seborrheic keratosis: Secondary | ICD-10-CM | POA: Diagnosis not present

## 2024-03-23 DIAGNOSIS — L918 Other hypertrophic disorders of the skin: Secondary | ICD-10-CM | POA: Diagnosis not present

## 2024-03-23 DIAGNOSIS — L309 Dermatitis, unspecified: Secondary | ICD-10-CM | POA: Diagnosis not present

## 2024-03-23 DIAGNOSIS — L089 Local infection of the skin and subcutaneous tissue, unspecified: Secondary | ICD-10-CM | POA: Diagnosis not present

## 2024-03-23 MED ORDER — DOXYCYCLINE HYCLATE 100 MG PO CAPS
100.0000 mg | ORAL_CAPSULE | Freq: Two times a day (BID) | ORAL | 0 refills | Status: AC
  Filled 2024-03-23: qty 14, 7d supply, fill #0

## 2024-03-23 MED ORDER — HYDROCORTISONE 2.5 % EX OINT
1.0000 | TOPICAL_OINTMENT | Freq: Two times a day (BID) | CUTANEOUS | 1 refills | Status: AC
  Filled 2024-03-23: qty 454, 30d supply, fill #0

## 2024-03-24 ENCOUNTER — Other Ambulatory Visit (HOSPITAL_COMMUNITY): Payer: Self-pay

## 2024-04-04 ENCOUNTER — Other Ambulatory Visit (HOSPITAL_COMMUNITY): Payer: Self-pay

## 2024-04-13 DIAGNOSIS — L2089 Other atopic dermatitis: Secondary | ICD-10-CM | POA: Diagnosis not present

## 2024-05-07 ENCOUNTER — Other Ambulatory Visit (HOSPITAL_COMMUNITY): Payer: Self-pay

## 2024-05-08 ENCOUNTER — Other Ambulatory Visit (HOSPITAL_COMMUNITY): Payer: Self-pay

## 2024-05-08 MED ORDER — ATORVASTATIN CALCIUM 20 MG PO TABS
20.0000 mg | ORAL_TABLET | Freq: Every day | ORAL | 0 refills | Status: DC
  Filled 2024-05-08: qty 90, 90d supply, fill #0

## 2024-05-23 ENCOUNTER — Other Ambulatory Visit (HOSPITAL_COMMUNITY): Payer: Self-pay

## 2024-05-24 ENCOUNTER — Other Ambulatory Visit (HOSPITAL_COMMUNITY): Payer: Self-pay

## 2024-05-24 MED ORDER — OLMESARTAN MEDOXOMIL-HCTZ 40-25 MG PO TABS
1.0000 | ORAL_TABLET | Freq: Every day | ORAL | 0 refills | Status: DC
  Filled 2024-05-24: qty 90, 90d supply, fill #0

## 2024-06-03 ENCOUNTER — Other Ambulatory Visit (HOSPITAL_COMMUNITY): Payer: Self-pay

## 2024-06-05 ENCOUNTER — Other Ambulatory Visit (HOSPITAL_COMMUNITY): Payer: Self-pay

## 2024-06-05 MED ORDER — POTASSIUM CHLORIDE CRYS ER 20 MEQ PO TBCR
20.0000 meq | EXTENDED_RELEASE_TABLET | Freq: Every day | ORAL | 0 refills | Status: DC
  Filled 2024-06-05: qty 90, 90d supply, fill #0

## 2024-06-12 DIAGNOSIS — R7301 Impaired fasting glucose: Secondary | ICD-10-CM | POA: Diagnosis not present

## 2024-06-12 DIAGNOSIS — R42 Dizziness and giddiness: Secondary | ICD-10-CM | POA: Diagnosis not present

## 2024-06-12 DIAGNOSIS — E876 Hypokalemia: Secondary | ICD-10-CM | POA: Diagnosis not present

## 2024-06-12 DIAGNOSIS — I10 Essential (primary) hypertension: Secondary | ICD-10-CM | POA: Diagnosis not present

## 2024-06-12 DIAGNOSIS — E785 Hyperlipidemia, unspecified: Secondary | ICD-10-CM | POA: Diagnosis not present

## 2024-06-12 DIAGNOSIS — Z0000 Encounter for general adult medical examination without abnormal findings: Secondary | ICD-10-CM | POA: Diagnosis not present

## 2024-06-15 ENCOUNTER — Other Ambulatory Visit (HOSPITAL_COMMUNITY): Payer: Self-pay

## 2024-06-15 ENCOUNTER — Encounter (HOSPITAL_COMMUNITY): Payer: Self-pay

## 2024-06-15 ENCOUNTER — Emergency Department (HOSPITAL_COMMUNITY)

## 2024-06-15 ENCOUNTER — Encounter (HOSPITAL_COMMUNITY): Payer: Self-pay | Admitting: Emergency Medicine

## 2024-06-15 ENCOUNTER — Other Ambulatory Visit: Payer: Self-pay

## 2024-06-15 ENCOUNTER — Emergency Department (HOSPITAL_COMMUNITY): Admission: EM | Admit: 2024-06-15 | Discharge: 2024-06-15 | Disposition: A | Discharge status: Home / Self Care

## 2024-06-15 DIAGNOSIS — M25531 Pain in right wrist: Secondary | ICD-10-CM | POA: Insufficient documentation

## 2024-06-15 DIAGNOSIS — S80211A Abrasion, right knee, initial encounter: Secondary | ICD-10-CM | POA: Diagnosis not present

## 2024-06-15 DIAGNOSIS — R519 Headache, unspecified: Secondary | ICD-10-CM | POA: Insufficient documentation

## 2024-06-15 DIAGNOSIS — Z043 Encounter for examination and observation following other accident: Secondary | ICD-10-CM | POA: Diagnosis not present

## 2024-06-15 DIAGNOSIS — M25512 Pain in left shoulder: Secondary | ICD-10-CM | POA: Insufficient documentation

## 2024-06-15 DIAGNOSIS — M25561 Pain in right knee: Secondary | ICD-10-CM | POA: Diagnosis not present

## 2024-06-15 DIAGNOSIS — M79644 Pain in right finger(s): Secondary | ICD-10-CM | POA: Diagnosis not present

## 2024-06-15 DIAGNOSIS — Z79899 Other long term (current) drug therapy: Secondary | ICD-10-CM | POA: Diagnosis not present

## 2024-06-15 DIAGNOSIS — S8991XA Unspecified injury of right lower leg, initial encounter: Secondary | ICD-10-CM | POA: Diagnosis present

## 2024-06-15 DIAGNOSIS — W010XXA Fall on same level from slipping, tripping and stumbling without subsequent striking against object, initial encounter: Secondary | ICD-10-CM | POA: Insufficient documentation

## 2024-06-15 DIAGNOSIS — I10 Essential (primary) hypertension: Secondary | ICD-10-CM | POA: Insufficient documentation

## 2024-06-15 DIAGNOSIS — M19012 Primary osteoarthritis, left shoulder: Secondary | ICD-10-CM | POA: Diagnosis not present

## 2024-06-15 DIAGNOSIS — W19XXXA Unspecified fall, initial encounter: Secondary | ICD-10-CM

## 2024-06-15 MED ORDER — METHOCARBAMOL 500 MG PO TABS
500.0000 mg | ORAL_TABLET | Freq: Two times a day (BID) | ORAL | 0 refills | Status: AC
  Filled 2024-06-15 (×2): qty 20, 10d supply, fill #0

## 2024-06-15 MED ORDER — KETOROLAC TROMETHAMINE 15 MG/ML IJ SOLN
15.0000 mg | Freq: Once | INTRAMUSCULAR | Status: AC
  Administered 2024-06-15: 15 mg via INTRAMUSCULAR
  Filled 2024-06-15: qty 1

## 2024-06-15 MED ORDER — ONDANSETRON HCL 4 MG PO TABS
4.0000 mg | ORAL_TABLET | Freq: Once | ORAL | Status: AC
  Administered 2024-06-15: 4 mg via ORAL
  Filled 2024-06-15: qty 1

## 2024-06-15 MED ORDER — ACETAMINOPHEN 325 MG PO TABS
650.0000 mg | ORAL_TABLET | Freq: Once | ORAL | Status: AC
  Administered 2024-06-15: 650 mg via ORAL
  Filled 2024-06-15: qty 2

## 2024-06-15 MED ORDER — NAPROXEN 500 MG PO TABS
500.0000 mg | ORAL_TABLET | Freq: Two times a day (BID) | ORAL | 0 refills | Status: AC
  Filled 2024-06-15 (×2): qty 30, 15d supply, fill #0

## 2024-06-15 NOTE — Discharge Instructions (Addendum)
 You are seen today for fall.  I have sent in pain medications for you to use as needed.  You can additionally can use Tylenol  for pain.  All of your imaging was NEGATIVE for any findings, injuries or other acute abnormalities related to your fall.  Please take Robaxin , 500 mg up to twice a day as needed for muscle spasm, this is a muscle relaxer, it may cause generalized weakness, sleepiness and you should not drive or do important things while taking this medication. Please take Naprosyn, 500mg  by mouth twice daily as needed for pain - this in an antiinflammatory medicine (NSAID) and is similar to ibuprofen - many people feel that it is stronger than ibuprofen and it is easier to take since it is a smaller pill.  Please use this only for 1 week - if your pain persists, you will need to follow up with your doctor in the office for ongoing guidance and pain control.   Follow-up with PCP for persistent symptoms.

## 2024-06-15 NOTE — ED Provider Notes (Signed)
 [place] [facility] EMERGENCY DEPARTMENT AT [facility] Provider Note   CSN: [id number] Arrival date & time: 06/15/24  1258     Patient presents with: [name] is a 58 y.o. female.   Fall  Patient is a 58 year old female presenting ED today after mechanical fall, tripping on the sidewalk while walking, leading to her falling onto left shoulder and landing on right hand.  Reporting left shoulder pain, right thumb pain, right knee pain with abrasion noted to right knee. HTN, herniated disc    Endorses mild left-sided, pulsatile headache with no light or sound sensitivity. Denies fever, vision changes, vertigo, dysphagia, unilateral weakness, chest pain, shortness of breath, abdominal pain. Denies LOC, blood thinners, numbness, weakness, tingling.  Was able to ambulate after the fall.  Arrived in c-collar by EMS.   Prior to Admission medications   Medication Sig Start Date End Date Taking? Authorizing Provider  methocarbamol  (ROBAXIN ) 500 MG tablet Take 1 tablet (500 mg total) by mouth 2 (two) times daily. 06/15/24  Yes Beola Terrall RAMAN, PA-C  naproxen (NAPROSYN) 500 MG tablet Take 1 tablet (500 mg total) by mouth 2 (two) times daily. 06/15/24  Yes Beola Terrall RAMAN, PA-C  amLODipine  (NORVASC ) 5 MG tablet Take 1 tablet (5 mg total) by mouth daily. 05/11/23     atorvastatin  (LIPITOR) 20 MG tablet Take 1 tablet (20 mg total) by mouth daily. 06/22/22     atorvastatin  (LIPITOR) 20 MG tablet Take 1 tablet (20 mg total) by mouth daily. 05/11/23     atorvastatin  (LIPITOR) 20 MG tablet Take 1 tablet (20 mg total) by mouth daily. 05/08/24     cetirizine  (ZYRTEC  ALLERGY) 10 MG tablet Take 1 tablet (10 mg total) by mouth daily. 04/27/22   Christopher Savannah, PA-C  doxycycline  (VIBRAMYCIN ) 100 MG capsule Take 1 capsule (100 mg total) by mouth 2 (two) times daily with a meal. Use sun protection. 03/23/24     fexofenadine (ALLEGRA) 180 MG tablet Take 180 mg by mouth daily.    [provider]  hydrocortisone  2.5 % ointment Apply 1 application to flared areas of the skin 1-2 times daily as needed. 12/09/20     hydrocortisone  2.5 % ointment Apply 1 Application topically on flared areas 2 (two) times daily as needed. 03/23/24     ipratropium (ATROVENT ) 0.03 % nasal spray Place 2 sprays into both nostrils 2 (two) times daily. 04/27/22   Christopher Savannah, PA-C  meclizine  (ANTIVERT ) 25 MG tablet Take 1 tablet (25 mg total) by mouth 3 (three) times daily as needed for dizziness 07/22/21     meclizine  (ANTIVERT ) 25 MG tablet Take 1 tablet (25 mg total) by mouth 3 (three) times daily as needed for dizziness 12/25/22     MECLIZINE  HCL PO Take by mouth as needed.    [provider]  olmesartan -hydrochlorothiazide  (BENICAR  HCT) 40-12.5 MG tablet Take 1 tablet by mouth daily.    [provider]  olmesartan -hydrochlorothiazide  (BENICAR  HCT) 40-12.5 MG tablet Take 1 tablet by mouth daily. 07/31/22     olmesartan -hydrochlorothiazide  (BENICAR  HCT) 40-12.5 MG tablet Take 1 tablet by mouth daily. 02/08/23     olmesartan -hydrochlorothiazide  (BENICAR  HCT) 40-12.5 MG tablet Take 1 tablet by mouth daily. 11/12/23     olmesartan -hydrochlorothiazide  (BENICAR  HCT) 40-25 MG tablet Take 1 tablet by mouth daily. 05/24/24     Potassium 75 MG TABS Take by mouth.    [provider]  potassium chloride  SA (KLOR-CON  M) 20 MEQ tablet Take  1 tablet (20 mEq total) by mouth daily with food. 06/05/24     potassium chloride  SA (KLOR-CON ) 20 MEQ tablet TAKE 1 TABLET BY MOUTH EVERY DAY WITH FOOD 04/11/20 04/23/21  Sun, Vyvyan, MD  potassium chloride  SA (KLOR-CON  M) 20 MEQ tablet Take 1 tablet (20 mEq total) by mouth daily with food 03/26/21     promethazine -dextromethorphan (PROMETHAZINE -DM) 6.25-15 MG/5ML syrup Take 2.5 mLs by mouth 3 (three) times daily as needed for cough. 04/27/22   Christopher Savannah, PA-C    Allergies: Penicillin v, Codeine, and Penicillins    Review of Systems  Musculoskeletal:   Positive for arthralgias.  All other systems reviewed and are negative.   Updated Vital Signs BP (!) 168/99   Pulse 74   Temp 98.7 F (37.1 C)   Resp 15   SpO2 98%   Physical Exam Vitals and nursing note reviewed.  Constitutional:      General: She is not in acute distress.    Appearance: Normal appearance. She is not ill-appearing or diaphoretic.  HENT:     Head: Normocephalic.     Comments: Notably has abrasion over the left forehead.    Right Ear: Tympanic membrane, ear canal and external ear normal.     Left Ear: Tympanic membrane, ear canal and external ear normal.  Eyes:     General: No scleral icterus.       Right eye: No discharge.        Left eye: No discharge.     Extraocular Movements: Extraocular movements intact.     Conjunctiva/sclera: Conjunctivae normal.     Pupils: Pupils are equal, round, and reactive to light.  Cardiovascular:     Rate and Rhythm: Normal rate and regular rhythm.     Pulses: Normal pulses.     Heart sounds: Normal heart sounds. No murmur heard.    No friction rub. No gallop.  Pulmonary:     Effort: Pulmonary effort is normal. No respiratory distress.     Breath sounds: No stridor. No wheezing, rhonchi or rales.  Chest:     Chest wall: No tenderness.  Abdominal:     General: Abdomen is flat. There is no distension.     Palpations: Abdomen is soft.     Tenderness: There is no abdominal tenderness. There is no right CVA tenderness, left CVA tenderness, guarding or rebound.  Musculoskeletal:        General: Tenderness (Tenderness noted over left AC joint as well as right wrist and over thumb of right hand.) and signs of injury (Abrasion noted to right knee) present. No swelling or deformity.     Cervical back: Normal range of motion. No rigidity.     Right lower leg: No edema.     Left lower leg: No edema.     Comments: Noted abrasion to right knee.  Tenderness over right knee, left shoulder at [facility] joint, right wrist/thumb.  Skin:     General: Skin is warm and dry.     Findings: No bruising, erythema or lesion.  Neurological:     General: No focal deficit present.     Mental Status: She is alert and oriented to person, place, and time. Mental status is at baseline.     Sensory: No sensory deficit.     Motor: No weakness.  Psychiatric:        Mood and Affect: Mood normal.     (all labs ordered are listed, but only abnormal results are displayed) Labs  Reviewed - No data to display  EKG: None  Radiology: DG Hand Complete Right Result Date: 06/15/2024 EXAM: 3 or more VIEW(S) XRAY OF THE RIGHT HAND 06/15/2024 03:08:00 PM COMPARISON: None available. CLINICAL HISTORY: R hand after a fall. FINDINGS: BONES AND JOINTS: No acute fracture. No focal osseous lesion. No joint dislocation. SOFT TISSUES: The soft tissues are unremarkable. IMPRESSION: 1. No acute osseous abnormality. Electronically signed by: Harrietta Sherry MD 06/15/2024 03:34 PM EDT RP Workstation: [id number]   DG Shoulder Left Result Date: 06/15/2024 EXAM: 1 VIEW XRAY OF THE LEFT SHOULDER 06/15/2024 03:08:00 PM COMPARISON: None available. CLINICAL HISTORY: Left shoulder pain after fall. FINDINGS: BONES AND JOINTS: Glenohumeral joint is normally aligned. No acute fracture or dislocation. The [facility] joint is anatomically aligned with mild degenerative changes. SOFT TISSUES: Unremarkable. IMPRESSION: 1. No acute fracture or dislocation of the left shoulder. Electronically signed by: Shahmeer Lateef MD 06/15/2024 03:32 PM EDT RP Workstation: [id number]     Procedures   Medications Ordered in the ED  ketorolac  (TORADOL ) 15 MG/ML injection 15 mg (15 mg Intramuscular Given 06/15/24 1510)  acetaminophen  (TYLENOL ) tablet 650 mg (650 mg Oral Given 06/15/24 1509)  ondansetron  (ZOFRAN ) tablet 4 mg (4 mg Oral Given 06/15/24 1507)     Medical Decision Making Amount and/or Complexity of Data Reviewed Radiology: ordered.  Risk OTC drugs. Prescription drug  management.  This patient is a 58 year old female who presents to the ED for concern of mechanical fall falling and complaining of right hand pain, left shoulder pain, right knee pain.  On physical exam, patient is in no acute distress, afebrile, alert and orient x 4, speaking in full sentences, nontachypneic, nontachycardic.  Notably has abrasions to left side of forehead and right knee.  As well as noted tenderness to left shoulder over [facility] joint as well as right wrist, thumb.  Able to stand.  Discussed heavily with patient's family about patient not needing CT scan however patient's family was adamant about getting CT scan.  Despite being [place] CT scan negative, no sign of skull fracture.  Provided Toradol  for pain/headache.  [place] CT head negative.  Provide Zofran  for nausea.  Additionally provided Tylenol .  Patient care transferred over to [facility], PA-C.  Pending x-rays and CT scan.  Likely discharge.  Differential diagnoses prior to evaluation: The emergent differential diagnosis includes, but is not limited to, fracture, ligamentous injury, neurovascular injury, dislocation, malalignment, intracranial bleed. This is not an exhaustive differential.   Past Medical History / Co-morbidities / Social History: Herniated disc, varicella, HTN  Additional history: Chart reviewed.   Lab Tests/Imaging studies: I personally interpreted labs/imaging and the pertinent results include:    X-ray of right hand shows no acute abnormality X-ray of left shoulder shows no acute fracture or dislocation X-ray of right knee pending CT head pending   I agree with the radiologist interpretation.   Medications: I ordered medication including Toradol , Zofran , Tylenol .  I have reviewed the patients home medicines and have made adjustments as needed.  Critical Interventions: None  Social Determinants of Health:  Disposition: 3:37 PM Care of Victoria Allen transferred to PA Lavanda Lesches, PA-C and Dr. Franklyn at the end of my shift as the patient will require reassessment once labs/imaging have resulted. Patient presentation, ED course, and plan of care discussed with review of all pertinent labs and imaging. Please see his/her note for further details regarding further ED course and disposition. Plan at time of handoff is await imaging, likely discharge with pain  medications. This may be altered or completely changed at the discretion of the oncoming team pending results of further workup.    Final diagnoses:  Fall, initial encounter    ED Discharge Orders          Ordered    naproxen (NAPROSYN) 500 MG tablet  2 times daily        06/15/24 1535    methocarbamol  (ROBAXIN ) 500 MG tablet  2 times daily        06/15/24 [address] PA-C 06/15/24 1537    Kammerer, Megan L, DO 06/18/24 [phone]

## 2024-06-15 NOTE — ED Triage Notes (Signed)
 Pt BIB [facility] with reports of mechanical fall while walking in the parking lot. Denies LOC, no blood thinner use. PT has abrasions to the left knee. Pt report head and neck pain.

## 2024-06-15 NOTE — ED Provider Notes (Signed)
 Patient here with mechanical fall. Forehead and knee abrasion. Follow up on images Physical Exam  BP (!) 168/99   Pulse 74   Temp 98.7 F (37.1 C)   Resp 15   SpO2 98%   Physical Exam  Procedures  Procedures  ED Course / MDM   Clinical Course as of 06/15/24 1707  Thu Jun 15, 2024  1705 CT Head Wo Contrast [AH]  1705 DG Knee 2 Views Right [AH]  1705 DG Hand Complete Right [AH]  1705 DG Shoulder Left [AH]  1705 I visualized and independently interpreted all the above images.  There are no acute findings on the imaging. [AH]    Clinical Course User Index [AH] Arloa Chroman, PA-C   Medical Decision Making Amount and/or Complexity of Data Reviewed Radiology: ordered. Decision-making details documented in ED Course.  Risk OTC drugs. Prescription drug management.   Safe for discharge       Arloa Chroman, PA-C 06/15/24 1707    Franklyn Sid SAILOR, MD 06/15/24 P[place]

## 2024-06-16 ENCOUNTER — Other Ambulatory Visit (HOSPITAL_COMMUNITY): Payer: Self-pay

## 2024-06-16 DIAGNOSIS — S0093XA Contusion of unspecified part of head, initial encounter: Secondary | ICD-10-CM | POA: Diagnosis not present

## 2024-06-16 DIAGNOSIS — E1165 Type 2 diabetes mellitus with hyperglycemia: Secondary | ICD-10-CM | POA: Diagnosis not present

## 2024-06-16 DIAGNOSIS — W19XXXA Unspecified fall, initial encounter: Secondary | ICD-10-CM | POA: Diagnosis not present

## 2024-06-16 MED ORDER — METFORMIN HCL ER 500 MG PO TB24
500.0000 mg | ORAL_TABLET | Freq: Every day | ORAL | 0 refills | Status: AC
  Filled 2024-06-16: qty 90, 90d supply, fill #0

## 2024-06-16 MED ORDER — FREESTYLE LITE W/DEVICE KIT
PACK | Freq: Every day | 0 refills | Status: AC
  Filled 2024-06-16: qty 1, 30d supply, fill #0

## 2024-06-16 MED ORDER — FREESTYLE LANCETS MISC
1.0000 | Freq: Every day | 1 refills | Status: AC
  Filled 2024-06-16: qty 100, 90d supply, fill #0

## 2024-06-16 MED ORDER — FREESTYLE LITE TEST VI STRP
1.0000 | ORAL_STRIP | Freq: Every day | 1 refills | Status: AC
  Filled 2024-06-16: qty 50, 50d supply, fill #0
  Filled 2024-09-06: qty 100, 90d supply, fill #1

## 2024-06-26 ENCOUNTER — Other Ambulatory Visit (HOSPITAL_COMMUNITY): Payer: Self-pay

## 2024-06-26 MED ORDER — OZEMPIC (0.25 OR 0.5 MG/DOSE) 2 MG/3ML ~~LOC~~ SOPN
PEN_INJECTOR | SUBCUTANEOUS | 0 refills | Status: AC
  Filled 2024-06-26: qty 3, 42d supply, fill #0
  Filled 2024-06-29: qty 3, 30d supply, fill #0

## 2024-06-27 ENCOUNTER — Encounter (HOSPITAL_COMMUNITY): Payer: Self-pay

## 2024-06-27 ENCOUNTER — Other Ambulatory Visit (HOSPITAL_COMMUNITY): Payer: Self-pay

## 2024-06-28 ENCOUNTER — Encounter (HOSPITAL_COMMUNITY): Payer: Self-pay

## 2024-06-29 ENCOUNTER — Other Ambulatory Visit (HOSPITAL_COMMUNITY): Payer: Self-pay

## 2024-07-10 ENCOUNTER — Ambulatory Visit
Admission: RE | Admit: 2024-07-10 | Discharge: 2024-07-10 | Disposition: A | Discharge status: Home / Self Care | Source: Ambulatory Visit | Attending: Family Medicine | Admitting: Family Medicine

## 2024-07-10 ENCOUNTER — Ambulatory Visit

## 2024-07-10 DIAGNOSIS — Z1231 Encounter for screening mammogram for malignant neoplasm of breast: Secondary | ICD-10-CM | POA: Diagnosis not present

## 2024-08-09 ENCOUNTER — Other Ambulatory Visit (HOSPITAL_COMMUNITY): Payer: Self-pay

## 2024-08-09 MED ORDER — OZEMPIC (0.25 OR 0.5 MG/DOSE) 2 MG/3ML ~~LOC~~ SOPN
0.5000 mg | PEN_INJECTOR | SUBCUTANEOUS | 0 refills | Status: DC
  Filled 2024-08-09: qty 9, 84d supply, fill #0

## 2024-08-10 ENCOUNTER — Other Ambulatory Visit (HOSPITAL_COMMUNITY): Payer: Self-pay

## 2024-08-27 ENCOUNTER — Other Ambulatory Visit (HOSPITAL_COMMUNITY): Payer: Self-pay

## 2024-08-28 ENCOUNTER — Other Ambulatory Visit: Payer: Self-pay

## 2024-08-28 ENCOUNTER — Other Ambulatory Visit (HOSPITAL_COMMUNITY): Payer: Self-pay

## 2024-08-28 MED ORDER — OLMESARTAN MEDOXOMIL-HCTZ 40-25 MG PO TABS
1.0000 | ORAL_TABLET | Freq: Every day | ORAL | 0 refills | Status: AC
  Filled 2024-08-28: qty 90, 90d supply, fill #0

## 2024-08-28 MED ORDER — ATORVASTATIN CALCIUM 20 MG PO TABS
20.0000 mg | ORAL_TABLET | Freq: Every day | ORAL | 0 refills | Status: AC
  Filled 2024-08-28: qty 90, 90d supply, fill #0

## 2024-09-03 ENCOUNTER — Other Ambulatory Visit (HOSPITAL_COMMUNITY): Payer: Self-pay

## 2024-09-04 ENCOUNTER — Other Ambulatory Visit (HOSPITAL_COMMUNITY): Payer: Self-pay

## 2024-09-04 MED ORDER — POTASSIUM CHLORIDE CRYS ER 20 MEQ PO TBCR
20.0000 meq | EXTENDED_RELEASE_TABLET | Freq: Every day | ORAL | 0 refills | Status: AC
  Filled 2024-09-04: qty 90, 90d supply, fill #0

## 2024-09-06 ENCOUNTER — Other Ambulatory Visit: Payer: Self-pay

## 2024-09-06 ENCOUNTER — Other Ambulatory Visit (HOSPITAL_COMMUNITY): Payer: Self-pay

## 2024-09-21 ENCOUNTER — Other Ambulatory Visit (HOSPITAL_COMMUNITY): Payer: Self-pay

## 2024-09-21 MED ORDER — OZEMPIC (0.25 OR 0.5 MG/DOSE) 2 MG/3ML ~~LOC~~ SOPN
0.5000 mg | PEN_INJECTOR | SUBCUTANEOUS | 0 refills | Status: AC
  Filled 2024-09-21 – 2024-10-06 (×2): qty 9, 84d supply, fill #0

## 2024-10-06 ENCOUNTER — Other Ambulatory Visit (HOSPITAL_COMMUNITY): Payer: Self-pay
